# Patient Record
Sex: Male | Born: 1970 | Race: Black or African American | Hispanic: No | Marital: Married | Smoking: Former smoker
Health system: Southern US, Community
[De-identification: ages and names within clinical notes are randomized; demographics above are authoritative.]

## PROBLEM LIST (undated history)

## (undated) DIAGNOSIS — G4730 Sleep apnea, unspecified: Secondary | ICD-10-CM

## (undated) DIAGNOSIS — M1990 Unspecified osteoarthritis, unspecified site: Secondary | ICD-10-CM

## (undated) DIAGNOSIS — F419 Anxiety disorder, unspecified: Secondary | ICD-10-CM

## (undated) DIAGNOSIS — F32A Depression, unspecified: Secondary | ICD-10-CM

## (undated) DIAGNOSIS — I10 Essential (primary) hypertension: Secondary | ICD-10-CM

## (undated) DIAGNOSIS — T8859XA Other complications of anesthesia, initial encounter: Secondary | ICD-10-CM

## (undated) DIAGNOSIS — T4145XA Adverse effect of unspecified anesthetic, initial encounter: Secondary | ICD-10-CM

## (undated) HISTORY — DX: Anxiety disorder, unspecified: F41.9

## (undated) HISTORY — PX: OTHER SURGICAL HISTORY: SHX169

## (undated) HISTORY — DX: Unspecified osteoarthritis, unspecified site: M19.90

## (undated) HISTORY — DX: Depression, unspecified: F32.A

## (undated) HISTORY — DX: Essential (primary) hypertension: I10

## (undated) HISTORY — PX: PATELLAR TENDON REPAIR: SHX737

## (undated) HISTORY — DX: Sleep apnea, unspecified: G47.30

---

## 2004-04-12 ENCOUNTER — Ambulatory Visit (HOSPITAL_COMMUNITY): Admission: RE | Admit: 2004-04-12 | Discharge: 2004-04-12 | Payer: Self-pay | Admitting: Orthopaedic Surgery

## 2006-02-26 ENCOUNTER — Ambulatory Visit (HOSPITAL_COMMUNITY): Admission: RE | Admit: 2006-02-26 | Discharge: 2006-02-26 | Payer: Self-pay | Admitting: Interventional Cardiology

## 2006-04-21 HISTORY — PX: ACHILLES TENDON REPAIR: SUR1153

## 2007-07-18 ENCOUNTER — Emergency Department (HOSPITAL_COMMUNITY): Admission: EM | Admit: 2007-07-18 | Discharge: 2007-07-18 | Payer: Self-pay | Admitting: Emergency Medicine

## 2007-10-08 ENCOUNTER — Emergency Department (HOSPITAL_COMMUNITY): Admission: EM | Admit: 2007-10-08 | Discharge: 2007-10-08 | Payer: Self-pay | Admitting: Emergency Medicine

## 2007-11-03 ENCOUNTER — Emergency Department (HOSPITAL_COMMUNITY): Admission: EM | Admit: 2007-11-03 | Discharge: 2007-11-03 | Payer: Self-pay | Admitting: Emergency Medicine

## 2010-09-06 NOTE — Op Note (Signed)
NAMEAMOUS, CREWE               ACCOUNT NO.:  [id number]   MEDICAL RECORD NO.:  [id number]          PATIENT TYPE:  OIB   LOCATION:  NA                           FACILITY:  [facility]   PHYSICIAN:  Vanita Panda. Magnus Ivan, M.D.DATE OF BIRTH:  [birth year]   DATE OF PROCEDURE:  04/12/2004  DATE OF DISCHARGE:                                 OPERATIVE REPORT   PREOPERATIVE DIAGNOSIS:  Left Achilles' tendon acute rupture.   POSTOPERATIVE DIAGNOSIS:  Left Achilles' tendon acute rupture.   PROCEDURE PERFORMED:  Left Achilles' tendon open repair.   SURGEON:  Vanita Panda. Magnus Ivan, M.D.   ANESTHESIA:  Regional block with general anesthesia.   TOURNIQUET TIME:  59 minutes.   COMPLICATIONS:  None.   ESTIMATED BLOOD LOSS:  Minimal.   INDICATIONS FOR PROCEDURE:  Mr. Dangerfield is a 40 year old gentleman who was  playing basketball three nights ago when he felt a pop in his left ankle.  He had weak plantar flexion after this as well as pain in the ankle.  He was  seen by me in the clinic and upon examination I could feel a palpable  deficit in the area of the mid substance of the Achilles' tendon.  He also  had no plantar flexion with  definitive testing using a Thompson test.  It  was recommended, given his acute rupture as well as his age and level of  activity that he should undergo open repair of this acute rupture.  The  risks and benefits of this were explained to him including the risk of re-  rupture without surgery and we surgery.  He agreed to proceed to the  operating room.   PROCEDURE DESCRIPTION:  After informed consent was obtained regional  anesthesia was obtained and he was brought to the operating room.  General  anesthesia was then obtained with him in the supine position.  He was rolled  onto the operating table in the prone position.  He had chest rolls and  padding under crucial areas including his elbow.  His tourniquet was placed  around the thigh and then his left ankle  and leg were prepped and draped  with DuraPrep and sterile drapes  An Esmarch was used to wrap about the  ankle and tourniquet was inflated to 300 mmHg.   An incision was made on the posterior aspect of the leg just medial to the  Achilles' tendon.  This was carried down through the superficial tissue and  almost immediately a hematoma was encountered.  There was noted a large  rupture of the Achilles' tendon approximately 4 cm from the insertion on the  calcaneus.  There was abundant amount of frayed tissue and again hematoma.  The hematoma was cleaned.  The peri-tenon was disrupted as well.  After  cleaning the two ends a #2 fiber wire was used in a Krackow whip stitch  fashion to secure both the proximal and distal pieces of the rupture tear.  Once the whip stitches were applied the ruptured ends were brought together  and secured.  This was then oversewn with interrupted 0 Vicryl  sutures.  The  wound was then irrigated again and the soft  tissue was closed with interrupted 2-0 Vicryl followed by interrupted 2-0  nylon on the skin.  A well padded splint with his foot in a plantar flexed  position was then applied.  The patient was awakened, extubated and taken to  the recovery room in stable condition.  There were no complications       CYB/MEDQ  D:  04/12/2004  T:  04/13/2004  Job:  [id number]

## 2011-01-13 LAB — DIFFERENTIAL
Basophils Absolute: 0
Basophils Relative: 0
Eosinophils Absolute: 0.1
Eosinophils Relative: 1
Lymphocytes Relative: 34
Lymphs Abs: 2.7
Monocytes Absolute: 0.7
Monocytes Relative: 9
Neutro Abs: 4.6
Neutrophils Relative %: 57

## 2011-01-13 LAB — POCT I-STAT, CHEM 8
BUN: 15
Calcium, Ion: 1.21
Chloride: 102
Creatinine, Ser: 1.4
Glucose, Bld: 92
HCT: 44
Hemoglobin: 15
Potassium: 4
Sodium: 137
TCO2: 28

## 2011-01-13 LAB — CBC
HCT: 41.3
Hemoglobin: 14.4
MCHC: 34.9
MCV: 86.1
Platelets: 200
RBC: 4.8
RDW: 13.7
WBC: 8.1

## 2011-12-19 ENCOUNTER — Ambulatory Visit (INDEPENDENT_AMBULATORY_CARE_PROVIDER_SITE_OTHER): Payer: 59 | Admitting: Surgery

## 2012-05-31 ENCOUNTER — Encounter (HOSPITAL_COMMUNITY): Payer: Self-pay

## 2012-05-31 ENCOUNTER — Emergency Department (HOSPITAL_COMMUNITY): Payer: 59

## 2012-05-31 ENCOUNTER — Emergency Department (HOSPITAL_COMMUNITY)
Admission: EM | Admit: 2012-05-31 | Discharge: 2012-05-31 | Disposition: A | Discharge status: Home / Self Care | Payer: 59 | Attending: Emergency Medicine | Admitting: Emergency Medicine

## 2012-05-31 DIAGNOSIS — R079 Chest pain, unspecified: Secondary | ICD-10-CM

## 2012-05-31 DIAGNOSIS — R0989 Other specified symptoms and signs involving the circulatory and respiratory systems: Secondary | ICD-10-CM | POA: Insufficient documentation

## 2012-05-31 DIAGNOSIS — R0789 Other chest pain: Secondary | ICD-10-CM | POA: Insufficient documentation

## 2012-05-31 DIAGNOSIS — R0609 Other forms of dyspnea: Secondary | ICD-10-CM | POA: Insufficient documentation

## 2012-05-31 LAB — CBC WITH DIFFERENTIAL/PLATELET
Basophils Absolute: 0 10*3/uL (ref 0.0–0.1)
Basophils Relative: 0 % (ref 0–1)
Eosinophils Absolute: 0.1 10*3/uL (ref 0.0–0.7)
Eosinophils Relative: 2 % (ref 0–5)
HCT: 40.1 % (ref 39.0–52.0)
Lymphocytes Relative: 38 % (ref 12–46)
Lymphs Abs: 2.3 10*3/uL (ref 0.7–4.0)
MCH: 29.2 pg (ref 26.0–34.0)
MCHC: 34.7 g/dL (ref 30.0–36.0)
Monocytes Absolute: 0.3 10*3/uL (ref 0.1–1.0)
Monocytes Relative: 6 % (ref 3–12)
Neutro Abs: 3.2 10*3/uL (ref 1.7–7.7)
Neutrophils Relative %: 54 % (ref 43–77)
Platelets: 205 10*3/uL (ref 150–400)
RDW: 13.1 % (ref 11.5–15.5)
WBC: 6 10*3/uL (ref 4.0–10.5)

## 2012-05-31 LAB — BASIC METABOLIC PANEL
BUN: 13 mg/dL (ref 6–23)
CO2: 25 mEq/L (ref 19–32)
Calcium: 8.4 mg/dL (ref 8.4–10.5)
Chloride: 97 mEq/L (ref 96–112)
Creatinine, Ser: 1.09 mg/dL (ref 0.50–1.35)
GFR calc Af Amer: >90 mL/min (ref >90)
GFR calc non Af Amer: 82 mL/min — ABNORMAL LOW (ref >90)
Glucose, Bld: 142 mg/dL — ABNORMAL HIGH (ref 70–99)
Potassium: 3.7 mEq/L (ref 3.5–5.1)
Sodium: 131 mEq/L — ABNORMAL LOW (ref 135–145)

## 2012-05-31 LAB — POCT I-STAT TROPONIN I: Troponin i, poc: 0 ng/mL (ref 0.00–0.08)

## 2012-05-31 NOTE — ED Provider Notes (Signed)
History     CSN: [id number]  Arrival date & time 05/31/12  0257   First MD Initiated Contact with Patient 05/31/12 0304      Chief Complaint  Patient presents with  . Chest Pain    (Consider location/radiation/quality/duration/timing/severity/associated sxs/prior treatment) HPI History provided by pt.   Pt has intermittent, squeezing pain in the center of his chest, with an associated sharp pain down his LUE and dyspnea x 2 weeks.  Episodes occur randomly and lasts for 1 second at at time.  Episode that woke him from sleep this mornign was the most severe he has had, prompting him to come to ED.  Has not had fever, cough, abd pain, LE edema/ttp.  Has never had these sx in the past.  No RF for ACS or PE.  No recent trauma or heavy lifting.   History reviewed. No pertinent past medical history.  History reviewed. No pertinent past surgical history.  History reviewed. No pertinent family history.  History  Substance Use Topics  . Smoking status: Not on file  . Smokeless tobacco: Not on file  . Alcohol Use: No      Review of Systems  All other systems reviewed and are negative.    Allergies  Review of patient's allergies indicates no known allergies.  Home Medications  No current outpatient prescriptions on file.  BP 160/89  Pulse 85  Temp(Src) 98.4 F (36.9 C) (Oral)  Resp 16  SpO2 97%  Physical Exam  Nursing note and vitals reviewed. Constitutional: He is oriented to person, place, and time. He appears well-developed and well-nourished. No distress.  HENT:  Head: Normocephalic and atraumatic.  Eyes:  Normal appearance  Neck: Normal range of motion.  Cardiovascular: Normal rate, regular rhythm and intact distal pulses.   Pulmonary/Chest: Effort normal and breath sounds normal. No respiratory distress. He exhibits no tenderness.  No pleuritic pain reported  Abdominal: Soft. Bowel sounds are normal. He exhibits no distension. There is no tenderness. There is no  guarding.  Musculoskeletal: Normal range of motion.  No peripheral edema or calf tenderness  Neurological: He is alert and oriented to person, place, and time.  Skin: Skin is warm and dry. No rash noted.  Psychiatric: He has a normal mood and affect. His behavior is normal.    ED Course  Procedures (including critical care time)   Date: 05/31/2012  Rate: 87  Rhythm: normal sinus rhythm  QRS Axis: normal  Intervals: normal  ST/T Wave abnormalities: normal  Conduction Disutrbances:none  Narrative Interpretation:   Old EKG Reviewed: unchanged   Labs Reviewed  CBC WITH DIFFERENTIAL  BASIC METABOLIC PANEL   Dg Chest 2 View  05/31/2012  *RADIOLOGY REPORT*  Clinical Data: Upper chest pain for 1 week.  CHEST - 2 VIEW  Comparison: Chest radiograph performed 11/03/2007  Findings: The lungs remain hypoexpanded.  Mild bibasilar airspace opacities likely reflect atelectasis, though pneumonia could conceivably have a similar appearance.  Mild vascular crowding is noted.  No pleural effusion or pneumothorax is seen.  The heart is normal in size; the mediastinal contour is within normal limits.  No acute osseous abnormalities are seen.  IMPRESSION: Lungs hypoexpanded; mild basilar airspace opacities likely reflect atelectasis, though mild pneumonia could conceivably have a similar appearance.   Original Report Authenticated By: Tonia Ghent, M.D.      1. Chest pain       MDM  42yo healthy M presents w/ CP.  Doubt ACS; pain is very atypical, EKG  non-ischemic, troponin neg.  No RF for or exam findings concerning for PE.  CXR shows atelectasis vs "mild pneumonia" but low suspicion for pneumonia based on lack of fever and cough.  Pt has been pain free in ED. He is comfortable with outpatient cardiology follow up.  Dr. Juleen China in agreement w/ A&P.  Return precautions discussed.         Otilio Miu, PA-C 05/31/12 2006

## 2012-05-31 NOTE — ED Notes (Signed)
Pt complains of a squeezing chest pain that started earlier tonight and took his breath away. No other symptoms

## 2012-06-03 NOTE — ED Provider Notes (Signed)
Medical screening examination/treatment/procedure(s) were performed by non-physician practitioner and as supervising physician I was immediately available for consultation/collaboration.  Howell Groesbeck, MD 06/03/12 0748 

## 2013-01-10 ENCOUNTER — Ambulatory Visit (INDEPENDENT_AMBULATORY_CARE_PROVIDER_SITE_OTHER): Payer: 59 | Admitting: Neurology

## 2013-01-10 ENCOUNTER — Other Ambulatory Visit: Payer: Self-pay | Admitting: Radiology

## 2013-01-10 ENCOUNTER — Encounter (INDEPENDENT_AMBULATORY_CARE_PROVIDER_SITE_OTHER): Payer: Self-pay | Admitting: Radiology

## 2013-01-10 ENCOUNTER — Other Ambulatory Visit: Payer: Self-pay | Admitting: Neurology

## 2013-01-10 DIAGNOSIS — G5600 Carpal tunnel syndrome, unspecified upper limb: Secondary | ICD-10-CM

## 2013-01-10 DIAGNOSIS — Z0289 Encounter for other administrative examinations: Secondary | ICD-10-CM

## 2013-01-10 NOTE — Procedures (Signed)
    [facility]  NCS (NERVE CONDUCTION STUDY) WITH EMG (ELECTROMYOGRAPHY) REPORT   STUDY DATE:  01/10/2013 PATIENT NAME: Javier Joyce DOB: Dec 11, 1970 MRN: [id number]    TECHNOLOGIST: Kaylyn Lim ELECTROMYOGRAPHER: Levert Feinstein M.D.  CLINICAL INFORMATION:  42 years old right-handed [place] American male, presenting with 3 months history of left hand paresthesia involving first 3 fingers, especially when he ride motor cycle.  FINDINGS: NERVE CONDUCTION STUDY: Bilateral ulnar sensory and motor responses were normal. Left median sensory response was absent. Left median mixed response showed moderately prolonged peak latency, with mildly decreased snap amplitude.  Left median motor response showed multiple moderately prolonged distal latency, with normal C. map amplitude, conduction velocity.  Right median sensory response showed mild to moderately prolonged peak latency.  Right median mixed response showed moderately prolonged peak latency, with normal snap amplitude.  Right median motor response showed moderately prolonged distal latency, with normal C. map amplitude, conduction velocity.    NEEDLE ELECTROMYOGRAPHY: Selected needle examination was performed at left upper extremity muscles, left cervical paraspinal muscles.  Left abductor pollicis brevis,  Normal insertion activity, no spontaneous activity, normal morphology motor unit potential,with decreased recruitment patterns. Needle examination of left pronator teres, extensor digitorum communis, biceps, triceps, deltoid was normal.  There was no spontaneous activity at left cervical paraspinal muscles, at left C5, 6 7        IMPRESSION:   This is an abnormal study. There is electrodiagnostic evidence of bilateral median neuropathy across the wrist, consistent with moderate bilateral carpal tunnel syndromes, left worse than right. There is no evidence of left cervical radiculopathy.   INTERPRETING PHYSICIAN:    Levert Feinstein M.D. Ph.D. [facility] [address], [address] [place], [place] [address] [phone]

## 2016-03-07 ENCOUNTER — Encounter (INDEPENDENT_AMBULATORY_CARE_PROVIDER_SITE_OTHER): Payer: Self-pay | Admitting: Neurology

## 2016-03-07 ENCOUNTER — Ambulatory Visit (INDEPENDENT_AMBULATORY_CARE_PROVIDER_SITE_OTHER): Payer: 59 | Admitting: Neurology

## 2016-03-07 DIAGNOSIS — Z0289 Encounter for other administrative examinations: Secondary | ICD-10-CM

## 2016-03-07 DIAGNOSIS — G5603 Carpal tunnel syndrome, bilateral upper limbs: Secondary | ICD-10-CM

## 2016-03-07 DIAGNOSIS — G5600 Carpal tunnel syndrome, unspecified upper limb: Secondary | ICD-10-CM | POA: Diagnosis not present

## 2016-03-07 NOTE — Procedures (Signed)
Full Name: Javier Joyce Gender: Male MRN #: [id number] Date of Birth: [birth year]    Visit Date: 03/07/2016 11:45 Age: 45 Years 61 Months Old Examining Physician: Marcial Pacas, MD  History: 45 year old male, with history of bilateral carpal tunnel syndrome, presented with worsening intermittent bilateral hands paresthesia especially first 3 fingers, also complains of diffuse muscle achy pain, reported elevated CPK to 900, symptoms has improved with vitamin D supplement.  Summary: Nerve conduction study: Left median sensory response was absent. Left median motor response showed moderately prolonged distal latency, with preserved Cmap amplitude.  Right median sensory response showed moderately prolonged peak latency, with decreased snap amplitude. Right median motor responses showed mild to moderately prolonged distal latency, with preserved Cmap amplitude.  Bilateral ulnar sensory and motor responses were normal  Left superficial peroneal, sural sensory responses were normal. Left tibial, superficial peroneal to EDB motor responses were normal.  Electromyography:  Selected needle examination of left lower extremity, upper extremity, left cervical paraspinal muscles showed no evidence of inflammatory myopathic changes.  Bilateral abductor pollicis brevis showed evidence of decreased recruitment, but there was no evidence of active denervation.   Conclusion: This is an abnormal study, there is electrodiagnostic evidence of bilateral median neuropathy across the wrist, consistent with moderate lateral couple tunnel syndromes, demyelinating in nature, there was no evidence of axonal loss. There was no evidence of inflammatory mild pathic changes.    ------------------------------- Marcial Pacas, M.D.  [facility] [place], [place] [address] Tel: [phone] Fax: [phone]        [facility]    Nerve / Sites Rec. Site Peak Lat Ref. Amp.1-2 Ref. Distance     ms ms V V cm  L Sural - Ankle (Calf)     Calf Ankle 4.38 ?4.40 9.8 ?6.0 14  L Superficial peroneal - Ankle     Lat leg Ankle 3.96 ?4.40 6.8 ?6.0 14  R Median, Ulnar - Transcarpal comparison     Median Palm Wrist 3.75 ?2.20 10.3 ?40.0 8     Ulnar Palm Wrist 2.14 ?2.20 10.9 ?12.0 8          L Median, Ulnar - Transcarpal comparison     Median Palm Wrist NR ?2.20 NR ?40.0 8     Ulnar Palm Wrist 1.93 ?2.20 8.3 ?12.0 8          R Median - Orthodromic (Dig II, Mid palm)     Dig II Wrist 4.79 ?3.40 5.7 ?10.0 13  L Median - Orthodromic (Dig II, Mid palm)     Dig II Wrist NR ?3.40 NR ?10.0 13  R Ulnar - Orthodromic, (Dig V, Mid palm)     Dig V Wrist 2.71 ?3.10 10.0 ?5.0 11  L Ulnar - Orthodromic, (Dig V, Mid palm)     Dig V Wrist 2.55 ?3.10 4.9 ?5.0 11     [facility]    Nerve / Sites Muscle Latency Ref. Amplitude Ref. Rel Amp Segments Distance Lat Diff Velocity Ref. Area    ms ms mV mV %  cm ms m/s m/s mVms  R Median - APB     Wrist APB 5.5 ?4.4 10.2 ?4.0 100 Wrist - APB 7    32.8     Upper arm APB 9.7  6.8  67.1 Upper arm - Wrist 24 4.2 57  24.4  L Median - APB     Wrist APB 7.9 ?4.4 5.0 ?4.0 100 Wrist - APB 7  23.1     Upper arm APB 12.1  5.7  113 Upper arm - Wrist 23 4.2 55    R Ulnar - ADM     Wrist ADM 2.6 ?3.3 11.4 ?6.0 100 Wrist - ADM 7    35.4     B.Elbow ADM 6.5  8.8  77.8 B.Elbow - Wrist 23 3.9 59 ?49 26.2     A.Elbow ADM 8.4  11.7  133 A.Elbow - B.Elbow 10 1.9 53 ?49 31.9         A.Elbow - Wrist  5.8     L Ulnar - ADM     Wrist ADM 2.7 ?3.3 12.8 ?6.0 100 Wrist - ADM 7    39.5     B.Elbow ADM 6.8  11.7  92 B.Elbow - Wrist 23 4.1 56 ?49 35.3     A.Elbow ADM 9.1  12.1  103 A.Elbow - B.Elbow 12 2.3 52 ?49 35.8         A.Elbow - Wrist  6.4     L Peroneal - EDB     Ankle EDB 5.9 ?6.5 4.3 ?2.0 100 Ankle - EDB 9    12.8     Fib head EDB 14.4  4.8  111 Fib head - Ankle 38 8.4 45 ?44      Pop fossa EDB 16.0  4.8  101 Pop fossa - Fib head 8 1.6 50 ?44 14.7         Pop fossa -  Ankle  10.1     L Tibial - AH     Ankle AH 4.8 ?5.8 12.3 ?4.0 100 Ankle - AH 9    29.5     Pop fossa AH 15.9  8.4  68.4 Pop fossa - Ankle 46 11.1 41 ?41 24.8     F  Wave    Nerve F Lat Ref.   ms ms  R Median - APB 32.8 ?31.0  L Ulnar - ADM 31.9 ?32.0     EMG       EMG Summary Table    Spontaneous MUAP Recruitment  Muscle IA Fib PSW Fasc Other Amp Dur. Poly Pattern  L. Tibialis anterior Normal None None None _______ Normal Normal Normal Normal  L. Vastus lateralis Normal None None None _______ Normal Normal Normal Normal  L. Biceps brachii Normal None None None _______ Normal Normal Normal Normal  L. Triceps brachii Normal None None None _______ Normal Normal Normal Normal  L. Deltoid Normal None None None _______ Normal Normal Normal Normal  L. Pronator teres Normal None None None _______ Normal Normal Normal Normal  L. Extensor digitorum communis Normal None None None _______ Normal Normal Normal Normal  L. Cervical paraspinals Normal None None None _______ Normal Normal Normal Normal  L. Abductor pollicis brevis Normal None None None _______ Normal Normal Normal Reduced  R. Abductor pollicis brevis Normal None None None _______ Normal Normal Normal Reduced

## 2016-09-22 DIAGNOSIS — I10 Essential (primary) hypertension: Secondary | ICD-10-CM | POA: Diagnosis not present

## 2016-09-22 DIAGNOSIS — H1045 Other chronic allergic conjunctivitis: Secondary | ICD-10-CM | POA: Diagnosis not present

## 2016-09-22 LAB — LIPID PANEL
Cholesterol: 186 (ref 0–200)
LDL CALC: 104
Triglycerides: 177 — AB (ref 40–160)

## 2016-09-22 LAB — BASIC METABOLIC PANEL
BUN: 14 (ref 4–21)
Creatinine: 1.3 (ref <=1.3)
Glucose: 153
Potassium: 3.9 (ref 3.4–5.3)
SODIUM: 138 (ref 137–147)

## 2016-09-22 LAB — CBC AND DIFFERENTIAL
HCT: 43 (ref 41–53)
Hemoglobin: 15.3 (ref 13.5–17.5)
Platelets: 217 (ref 150–399)
WBC: 8

## 2016-09-22 LAB — VITAMIN B12: VITAMIN B 12: 488

## 2016-09-22 LAB — HEPATIC FUNCTION PANEL
ALK PHOS: 118 (ref 25–125)
ALT: 78 — AB (ref 10–40)
AST: 52 — AB (ref 14–40)
BILIRUBIN, TOTAL: 0.8

## 2016-09-22 LAB — TSH: TSH: 1.8 (ref <=5.90)

## 2016-10-02 DIAGNOSIS — G4733 Obstructive sleep apnea (adult) (pediatric): Secondary | ICD-10-CM | POA: Diagnosis not present

## 2016-10-31 DIAGNOSIS — I10 Essential (primary) hypertension: Secondary | ICD-10-CM | POA: Diagnosis not present

## 2016-12-05 DIAGNOSIS — G4733 Obstructive sleep apnea (adult) (pediatric): Secondary | ICD-10-CM | POA: Diagnosis not present

## 2016-12-18 DIAGNOSIS — K8010 Calculus of gallbladder with chronic cholecystitis without obstruction: Secondary | ICD-10-CM | POA: Diagnosis not present

## 2017-01-07 DIAGNOSIS — G4733 Obstructive sleep apnea (adult) (pediatric): Secondary | ICD-10-CM | POA: Diagnosis not present

## 2017-01-12 ENCOUNTER — Other Ambulatory Visit (HOSPITAL_COMMUNITY): Payer: Self-pay | Admitting: Surgery

## 2017-01-23 ENCOUNTER — Ambulatory Visit (HOSPITAL_COMMUNITY)
Admission: RE | Admit: 2017-01-23 | Discharge: 2017-01-23 | Disposition: A | Discharge status: Home / Self Care | Payer: 59 | Source: Ambulatory Visit | Attending: Surgery | Admitting: Surgery

## 2017-01-28 ENCOUNTER — Encounter: Payer: Self-pay | Admitting: Skilled Nursing Facility1

## 2017-01-28 ENCOUNTER — Encounter: Payer: 59 | Attending: Surgery | Admitting: Skilled Nursing Facility1

## 2017-01-28 DIAGNOSIS — Z713 Dietary counseling and surveillance: Secondary | ICD-10-CM | POA: Insufficient documentation

## 2017-01-28 DIAGNOSIS — E669 Obesity, unspecified: Secondary | ICD-10-CM

## 2017-01-28 NOTE — Progress Notes (Signed)
Pre-Op Assessment Visit:  Pre-Operative Sleeve Gastrectomy Surgery  Medical Nutrition Therapy:  Appt start time: 4:05  End time:  5:05  Patient was seen on 01/28/2017 for Pre-Operative Nutrition Assessment. Assessment and letter of approval faxed to [facility] Surgery Bariatric Surgery Program coordinator on 01/28/2017.   Will work on meal ideas and eating more foods from home.   Pt states he has an active job requiring heavy lifting and walking.   Pt expectation of surgery: fro everything to be easier from choosing clothes to moving  Pt expectation of Dietitian:   Start weight at [facility]: 324.8 BMI: 44.67   24 hr Dietary Recall: 90% meals from outside the home First Meal: skipped Snack:  Second Meal 11:30-2:30: fats food Snack:  Third Meal (6:30-7pm): olive garden Snack:  Beverages: diet soda, flavored carbonated water  Encouraged to engage in 150 minutes of moderate physical activity including cardiovascular and weight baring weekly  Handouts given during visit include:  . Pre-Op Goals . Bariatric Surgery Protein Shakes During the appointment today the following Pre-Op Goals were reviewed with the patient: . Maintain or lose weight as instructed by your surgeon . Make healthy food choices . Begin to limit portion sizes . Limited concentrated sugars and fried foods . Keep fat/sugar in the single digits per serving on             food labels . Practice CHEWING your food  (aim for 30 chews per bite or until applesauce consistency) . Practice not drinking 15 minutes before, during, and 30 minutes after each meal/snack . Avoid all carbonated beverages  . Avoid/limit caffeinated beverages  . Avoid all sugar-sweetened beverages . Consume 3 meals per day; eat every 3-5 hours . Make a list of non-food related activities . Aim for 64-100 ounces of FLUID daily  . Aim for at least 60-80 grams of PROTEIN daily . Look for a liquid protein source that contain ?15 g protein and  ?5 g carbohydrate  (ex: shakes, drinks, shots)  -Follow diet recommendations listed below   Energy and Macronutrient Recomendations: Calories: 2000 Carbohydrate:225 Protein: 150 Fat: 56  Demonstrated degree of understanding via:  Teach Back  Teaching Method Utilized:  Visual Auditory Hands on  Barriers to learning/adherence to lifestyle change: none identified   Patient to call the Nutrition and Diabetes Education Services to enroll in Pre-Op and Post-Op Nutrition Education when surgery date is scheduled.

## 2017-02-03 ENCOUNTER — Other Ambulatory Visit: Payer: Self-pay | Admitting: Surgery

## 2017-02-03 DIAGNOSIS — Z9884 Bariatric surgery status: Secondary | ICD-10-CM

## 2017-02-23 ENCOUNTER — Encounter: Payer: 59 | Attending: Surgery | Admitting: Skilled Nursing Facility1

## 2017-02-23 DIAGNOSIS — E669 Obesity, unspecified: Secondary | ICD-10-CM

## 2017-02-23 DIAGNOSIS — Z713 Dietary counseling and surveillance: Secondary | ICD-10-CM | POA: Diagnosis not present

## 2017-02-23 NOTE — Progress Notes (Signed)
  Assessment:   1st SWL Appointment.   Pt arrives having going about 5 pounds. Only one burger verses 2. Pt states his wife has had the lapband and since then they have had troubles with their relationship and his wife had kidney stones and an enlarged esophagus. Pt states this is hard, I did not even remember I was supposed to be watching what I was eating throughout the month. Dietitian educated the pt on the consequences of not following the recombinations after surgery. Pt states he will work on eating every 5 hours.   Pt needs 6 months of SWL.   Surgery: Sleeve Gastrectomy Start weight at [facility]: 324.8 Wt: 329  MEDICATIONS: See List   DIETARY INTAKE:  24-hr recall:  B ( AM): skipped Snk ( AM): grapes L ( PM): fast food Snk ( PM): grapes D ( PM): skipped Snk ( PM):  Beverages: water  Usual physical activity: ADL's  Diet to Follow: 1800 calories 200 g carbohydrates 135 g protein 50 g fat   Nutritional Diagnosis:  [place]-3.3 Overweight/obesity related to past poor dietary habits and physical inactivity as evidenced by patient w/ planned sleeve surgery following dietary guidelines for continued weight loss.    Intervention:  Nutrition counseling for upcoming Bariatric Surgery. Goals: -Encouraged to engage in 150 minutes of moderate physical activity including cardiovascular and weight baring weekly -Eat every 5 hours  Teaching Method Utilized:  Visual Auditory Hands on  Barriers to learning/adherence to lifestyle change: contemplative stage  Demonstrated degree of understanding via:  Teach Back   Monitoring/Evaluation:  Dietary intake, exercise, and body weight prn.

## 2017-03-03 DIAGNOSIS — I10 Essential (primary) hypertension: Secondary | ICD-10-CM | POA: Diagnosis not present

## 2017-03-06 DIAGNOSIS — G5603 Carpal tunnel syndrome, bilateral upper limbs: Secondary | ICD-10-CM | POA: Diagnosis not present

## 2017-03-25 ENCOUNTER — Ambulatory Visit: Payer: 59 | Admitting: Skilled Nursing Facility1

## 2017-03-31 ENCOUNTER — Encounter: Payer: 59 | Attending: Surgery | Admitting: Skilled Nursing Facility1

## 2017-03-31 ENCOUNTER — Encounter: Payer: Self-pay | Admitting: Skilled Nursing Facility1

## 2017-03-31 DIAGNOSIS — Z713 Dietary counseling and surveillance: Secondary | ICD-10-CM | POA: Insufficient documentation

## 2017-03-31 NOTE — Progress Notes (Signed)
  Assessment:   2nd SWL Appointment.   Pt needs 6 months of SWL.  Pt arrives having gained about 6 pounds. Pt is vexed he needs so many SWL visits. Pt states he is not really eating anything at all. Pt states he cannot bring food into work because he does not trust his coworkers. Pt does not understand why he needs to come to the SWL visits if the surgery is going to do everything for him: Dietitian educated the pt on this subject and pt still repeated, Why do I need to keep coming to the dietitian if the surgery is going to tell me what I can and cannot eat. Pt does not understand the need to fuel/hydrate his body: Dietitian educated the pt on this subject but the pt continued to repeat his previous thoughts.   Surgery: Sleeve Gastrectomy Start weight at [facility]: 324.8 Wt: 335  MEDICATIONS: See List   DIETARY INTAKE:  24-hr recall:  B ( AM): atkins or carnation instant breakfast  Snk ( AM): grapes L ( PM): ritz crackers and cheese Snk ( PM): grapes D ( PM): club sandwich with chips Snk ( PM):  Beverages: crystal light water  Usual physical activity: ADL's  Diet to Follow: 1800 calories 200 g carbohydrates 135 g protein 50 g fat   Nutritional Diagnosis:  [place]-3.3 Overweight/obesity related to past poor dietary habits and physical inactivity as evidenced by patient w/ planned sleeve surgery following dietary guidelines for continued weight loss.    Intervention:  Nutrition counseling for upcoming Bariatric Surgery. Goals: -Encouraged to engage in 150 minutes of moderate physical activity including cardiovascular and weight baring weekly -Eat every 5 hours  Teaching Method Utilized:  Visual Auditory Hands on  Barriers to learning/adherence to lifestyle change: contemplative stage  Demonstrated degree of understanding via:  Teach Back   Monitoring/Evaluation:  Dietary intake, exercise, and body weight prn.

## 2017-04-30 ENCOUNTER — Ambulatory Visit: Payer: 59 | Admitting: Skilled Nursing Facility1

## 2017-05-06 ENCOUNTER — Encounter: Payer: Self-pay | Admitting: Registered"

## 2017-05-06 ENCOUNTER — Encounter: Payer: 59 | Attending: Surgery | Admitting: Registered"

## 2017-05-06 DIAGNOSIS — Z713 Dietary counseling and surveillance: Secondary | ICD-10-CM | POA: Diagnosis present

## 2017-05-06 DIAGNOSIS — E669 Obesity, unspecified: Secondary | ICD-10-CM

## 2017-05-06 NOTE — Patient Instructions (Addendum)
-   Increase vegetables intake with meals and snacks.   - Aim to chew at least 30 times per bite or to applesauce consistency. Aim to listen to your body for when it is satisfied.

## 2017-05-06 NOTE — Progress Notes (Signed)
  Assessment: 3rd SWL Appointment.   Pt needs 6 months of SWL.  Pt arrives having lost about 13.5 pounds from previous visit. Pt states he is drinking between 4-5 16.9 oz bottles a day. Pt states he doesn't have time to eat meals for breakfast and lunch; expects that schedule will slow down soon to enable more well-balanced meals. Pt states his wife had LAGB and he's familiar with some habits. Pt state he eats about 3 1/2 meals a day.   Pt is vexed he needs so many SWL visits. Pt states he is not really eating anything at all. Pt states he cannot bring food into work because he does not trust his coworkers. Pt does not understand why he needs to come to the SWL visits if the surgery is going to do everything for him: Dietitian educated the pt on this subject and pt still repeated, Why do I need to keep coming to the dietitian if the surgery is going to tell me what I can and cannot eat. Pt does not understand the need to fuel/hydrate his body: Dietitian educated the pt on this subject but the pt continued to repeat his previous thoughts.   Surgery: Sleeve Gastrectomy Start weight at [facility]: 324.8 Wt: 321.5 BMI: 44.22  MEDICATIONS: See List   DIETARY INTAKE:  24-hr recall:  B ( AM): Premier protein  Snk ( AM): none L ( PM): Premier protein Snk ( PM): sometimes grapes D ( PM): shrimp, pasta or steak, potatoes Snk ( PM): sometimes grapes or tangerines Beverages: crystal light water, Coke (occasionally)  Usual physical activity: ADL's  Diet to Follow: 1800 calories 200 g carbohydrates 135 g protein 50 g fat   Nutritional Diagnosis:  [place]-3.3 Overweight/obesity related to past poor dietary habits and physical inactivity as evidenced by patient w/ planned sleeve surgery following dietary guidelines for continued weight loss.    Intervention:  Nutrition counseling for upcoming Bariatric Surgery. Goals: -Encouraged to engage in 150 minutes of moderate physical activity including  cardiovascular and weight baring weekly - Increase vegetables intake with meals and snacks.  - Aim to chew at least 30 times per bite or to applesauce consistency. Aim to listen to your body for when it is satisfied.   Teaching Method Utilized:  Visual Auditory Hands on  Barriers to learning/adherence to lifestyle change: contemplative stage   Demonstrated degree of understanding via:  Teach Back   Monitoring/Evaluation:  Dietary intake, exercise, and body weight in 1 month(s).

## 2017-06-02 ENCOUNTER — Encounter: Payer: 59 | Attending: Surgery | Admitting: Registered"

## 2017-06-02 ENCOUNTER — Encounter: Payer: Self-pay | Admitting: Registered"

## 2017-06-02 DIAGNOSIS — Z713 Dietary counseling and surveillance: Secondary | ICD-10-CM | POA: Insufficient documentation

## 2017-06-02 DIAGNOSIS — E669 Obesity, unspecified: Secondary | ICD-10-CM

## 2017-06-02 NOTE — Patient Instructions (Addendum)
-   Aim to not drink 15 minutes before eating, not while eating, and waiting 30 minutes after eating.   - Continue to work on chewing.   - Aim to replace protein shakes during lunch with meals.

## 2017-06-02 NOTE — Progress Notes (Signed)
Surgery: Sleeve Gastrectomy Assessment: 4th SWL Appointment.   Start weight at [facility]: 324.8 Wt: 321.5, 320.0 BMI: 44.01   Pt needs 6 months of SWL.  Pt arrives having lost about 1.5 pounds from previous visit. Pt states he is working on listening to his body for fullness cues. Pt states he did well with chewing at least 30 times per bite and still working on it. Pt states he is ready to have surgery. Pt states he has a friend in [place] who is having post-op complications and he is concerned this will happen to him. Pt was encouraged to create healthy habits now to put himself in a good position for minimal post-op complications. Pt states he has a 97 year old daughter.   Pt states he is drinking between 4-5 16.9 oz bottles a day. Pt states he doesn't have time to eat meals for breakfast and lunch; expects that schedule will slow down soon to enable more well-balanced meals. Pt states his wife had LAGB and he's familiar with some habits. Pt state he eats about 3 1/2 meals a day.   Pt is vexed he needs so many SWL visits. Pt states he is not really eating anything at all. Pt states he cannot bring food into work because he does not trust his coworkers. Pt does not understand why he needs to come to the SWL visits if the surgery is going to do everything for him: Dietitian educated the pt on this subject and pt still repeated, Why do I need to keep coming to the dietitian if the surgery is going to tell me what I can and cannot eat. Pt does not understand the need to fuel/hydrate his body: Dietitian educated the pt on this subject but the pt continued to repeat his previous thoughts.    MEDICATIONS: See List   DIETARY INTAKE:  24-hr recall:  B ( AM): Premier protein  Snk ( AM): none L ( PM): Premier protein or [place] Mikes sub Snk ( PM): sometimes grapes, pineapple, kiwi D ( PM): shrimp, pasta or steak, potatoes Snk ( PM): sometimes grapes or tangerines Beverages: crystal light water, diet Coke  (occasionally), lemonade  Usual physical activity: ADL's  Diet to Follow: 1800 calories 200 g carbohydrates 135 g protein 50 g fat   Nutritional Diagnosis:  -3.3 Overweight/obesity related to past poor dietary habits and physical inactivity as evidenced by patient w/ planned sleeve surgery following dietary guidelines for continued weight loss.    Intervention:  Nutrition counseling for upcoming Bariatric Surgery. Goals: -Encouraged to engage in 150 minutes of moderate physical activity including cardiovascular and weight baring weekly - Aim to not drink 15 minutes before eating, not while eating, and waiting 30 minutes after eating.  - Continue to work on chewing.  - Aim to replace protein shakes during lunch with meals.   Teaching Method Utilized:  Visual Auditory Hands on  Barriers to learning/adherence to lifestyle change: contemplative stage   Demonstrated degree of understanding via:  Teach Back   Monitoring/Evaluation:  Dietary intake, exercise, and body weight in 1 month(s).

## 2017-06-29 ENCOUNTER — Encounter: Payer: 59 | Attending: Surgery | Admitting: Registered"

## 2017-06-29 ENCOUNTER — Encounter: Payer: Self-pay | Admitting: Registered"

## 2017-06-29 DIAGNOSIS — E669 Obesity, unspecified: Secondary | ICD-10-CM

## 2017-06-29 DIAGNOSIS — Z713 Dietary counseling and surveillance: Secondary | ICD-10-CM | POA: Diagnosis not present

## 2017-06-29 NOTE — Patient Instructions (Addendum)
-   Continue to focus on not drinking with meals. Aim to leave drink in fridge until after eating.   - Increase physical activity with 15 min, 3 days/week.

## 2017-06-29 NOTE — Progress Notes (Signed)
Surgery: Sleeve Gastrectomy Assessment: 5th SWL Appointment.   Start weight at [facility]: 324.8 Wt: 326.7 BMI: 44.93   Pt needs 6 months of SWL.  Pt arrives having gained about 6.7 pounds from previous visit. Pt states he is still working on chewing; going well. Pt states not drinking with meals is most challenging especially when eating meats. Pt states he knows it is just a habit of drinking while eating. Pt states he has been eating more meals during lunch time instead of drinking shakes. Pt reports frustration with communicating with [facility] about moving forward with his surgery date.   Pt states he is working on listening to his body for fullness cues. Pt states he is ready to have surgery. Pt states he has a 59 year old daughter.   Pt states he is drinking between 4-5 16.9 oz bottles a day. Pt state he eats about 3 1/2 meals a day.   Pt is vexed he needs so many SWL visits. Pt states he is not really eating anything at all. Pt states he cannot bring food into work because he does not trust his coworkers. Pt does not understand why he needs to come to the SWL visits if the surgery is going to do everything for him: Dietitian educated the pt on this subject and pt still repeated, Why do I need to keep coming to the dietitian if the surgery is going to tell me what I can and cannot eat. Pt does not understand the need to fuel/hydrate his body: Dietitian educated the pt on this subject but the pt continued to repeat his previous thoughts.    MEDICATIONS: See List   DIETARY INTAKE:  24-hr recall:  B ( AM): Premier protein  Snk ( AM): none L ( PM): Premier protein or [place] Mikes sub Snk ( PM): mangoes, plums D ( PM): shrimp, pasta or steak, potatoes or grilled chicken, broccoli, cauliflower mash Snk ( PM): cheese-itz Beverages: crystal light water, diet Coke (occasionally), lemonade  Usual physical activity: ADL's; limited lower body mobility due to knees  Diet to Follow: 1800 calories 200  g carbohydrates 135 g protein 50 g fat   Nutritional Diagnosis:  [place]-3.3 Overweight/obesity related to past poor dietary habits and physical inactivity as evidenced by patient w/ planned sleeve surgery following dietary guidelines for continued weight loss.    Intervention:  Nutrition counseling for upcoming Bariatric Surgery. Goals: -Encouraged to engage in 150 minutes of moderate physical activity including cardiovascular and weight baring weekly - Continue to focus on not drinking with meals. Aim to leave drink in fridge until after eating.  - Increase physical activity with 15 min, 3 days/week.  Teaching Method Utilized:  Visual Auditory Hands on  Barriers to learning/adherence to lifestyle change: contemplative stage   Demonstrated degree of understanding via:  Teach Back   Monitoring/Evaluation:  Dietary intake, exercise, and body weight in 1 month(s).

## 2017-07-21 ENCOUNTER — Encounter: Payer: Self-pay | Admitting: Family Medicine

## 2017-07-21 ENCOUNTER — Ambulatory Visit: Payer: 59 | Admitting: Family Medicine

## 2017-07-21 VITALS — BP 164/90 | HR 80 | Ht 72.0 in | Wt 322.4 lb

## 2017-07-21 DIAGNOSIS — M25562 Pain in left knee: Secondary | ICD-10-CM

## 2017-07-21 DIAGNOSIS — Z114 Encounter for screening for human immunodeficiency virus [HIV]: Secondary | ICD-10-CM | POA: Diagnosis not present

## 2017-07-21 DIAGNOSIS — N529 Male erectile dysfunction, unspecified: Secondary | ICD-10-CM

## 2017-07-21 DIAGNOSIS — G8929 Other chronic pain: Secondary | ICD-10-CM | POA: Diagnosis not present

## 2017-07-21 DIAGNOSIS — M25561 Pain in right knee: Secondary | ICD-10-CM | POA: Diagnosis not present

## 2017-07-21 DIAGNOSIS — I10 Essential (primary) hypertension: Secondary | ICD-10-CM

## 2017-07-21 LAB — BASIC METABOLIC PANEL
BUN: 13 mg/dL (ref 6–23)
CO2: 29 mEq/L (ref 19–32)
Calcium: 9 mg/dL (ref 8.4–10.5)
Chloride: 99 mEq/L (ref 96–112)
Creatinine, Ser: 1.11 mg/dL (ref 0.40–1.50)
GFR: 90.97 mL/min (ref >60.00)
Glucose, Bld: 134 mg/dL — ABNORMAL HIGH (ref 70–99)
Potassium: 3.5 mEq/L (ref 3.5–5.1)
Sodium: 136 mEq/L (ref 135–145)

## 2017-07-21 MED ORDER — AVANAFIL 200 MG PO TABS: 1.0000 | ORAL_TABLET | Freq: Every day | ORAL | 3 refills | Status: DC | PRN

## 2017-07-21 MED ORDER — LISINOPRIL 20 MG PO TABS
20.0000 mg | ORAL_TABLET | Freq: Every day | ORAL | 3 refills | Status: DC
Start: 2017-07-21 — End: 2017-08-18

## 2017-07-21 NOTE — Assessment & Plan Note (Signed)
BP elevated, no longer taking losartan due to concern about recall.  Will change to lisinopril, he will let me know if he has any issues with tolerance of this.  He'll continue to follow with dietician.  Return for BP check in ~3-4 weeks.

## 2017-07-21 NOTE — Assessment & Plan Note (Signed)
In planning stages for bariatric surgery.  Encouraged to continue to follow with dietician and work on weight loss.

## 2017-07-21 NOTE — Progress Notes (Addendum)
Subjective:    Patient ID: Javier Joyce, male    DOB: 09/08/1970, 47 y.o.   MRN: [id number]  Chief Complaint  Patient presents with  . New Patient (Initial Visit)  . Hypertension  . Knee Pain  . Erectile Dysfunction   Javier Joyce is a very pleasant 47 y.o. male familiar to me from the [place] [facility].     Hypertension  This is a chronic problem. The current episode started more than 1 year ago. The problem is uncontrolled. Pertinent negatives include no anxiety, blurred vision, chest pain, headaches, malaise/fatigue, neck pain, orthopnea, palpitations, peripheral edema or shortness of breath. There are no associated agents to hypertension. Risk factors for coronary artery disease include obesity and male gender. Past treatments include angiotensin blockers (Stopped taking due to recall of losartan. ). Compliance problems include diet and exercise (Has been seeing dietician as he is in stages of planning for bariatric surgery.).  There is no history of angina, kidney disease, CAD/MI, CVA or heart failure.  Erectile Dysfunction  This is a chronic problem. The current episode started more than 1 month ago. The problem has been waxing and waning since onset. The nature of his difficulty is achieving erection and maintaining erection. He reports no anxiety, decreased libido or performance anxiety. He reports his erection duration to be 1 to 5 minutes. Obstructive symptoms do not include incomplete emptying, an intermittent stream or straining. Pertinent negatives include no chills, genital pain or inability to urinate. Nothing aggravates the symptoms. Past treatments include sildenafil. The treatment provided moderate relief. He has had no adverse reactions caused by medications. Risk factors include hypertension.  Knee pain History of bilateral repair of patellar tendon 2/2 to injury from [employer].  Had injection in Oct-Nov of last year by me through the [place] system.  Reports that  this worked very well for him at the time. Reports that he is having some increased pain again with feeling of knee "slipping".  Denies any swelling or locking of the knee at this time. Prior xrays completed through the [place] system, will have records sent over.        Review of Systems  Constitutional: Negative for chills and malaise/fatigue.  Eyes: Negative for blurred vision.  Respiratory: Negative for shortness of breath.   Cardiovascular: Negative for chest pain, palpitations and orthopnea.  Genitourinary: Negative for decreased libido and incomplete emptying.  Musculoskeletal: Negative for neck pain.  Neurological: Negative for headaches.  Psychiatric/Behavioral: The patient is not nervous/anxious.   Additional ROS per HPI, otherwise negative  Past Medical History:  Diagnosis Date  . Hypertension   . Sleep apnea    Past Surgical History:  Procedure Laterality Date  . PATELLAR TENDON REPAIR Bilateral   . right and left knee repair          Objective:   Physical Exam  Constitutional: He is oriented to person, place, and time. He appears well-nourished. No distress.  HENT:  Head: Normocephalic and atraumatic.  Mouth/Throat: Oropharynx is clear and moist.  Eyes: No scleral icterus.  Neck: Neck supple. No thyromegaly present.  Cardiovascular: Normal rate, regular rhythm and normal heart sounds.  Pulmonary/Chest: Effort normal and breath sounds normal.  Musculoskeletal:  Bilateral anterior scars along both knees.  No effusion noted.  Mild grinding sensation with flexion/extension.  Increased pain with patellar compression.  No patellar subluxation or significant instability.   Lymphadenopathy:    He has no cervical adenopathy.  Neurological: He is alert and  oriented to person, place, and time.  Psychiatric: He has a normal mood and affect. His behavior is normal.          Assessment & Plan:  Bilateral knee pain Chronic bilateral knee pain, history of prior patellar  tendon repair.  Good response to steroid injection previously and would like to have repeated.  He will return in ~3-4 weeks to have this done.  Discussed importance of weight loss to help with knee pain as well.   Essential hypertension BP elevated, no longer taking losartan due to concern about recall.  Will change to lisinopril, he will let me know if he has any issues with tolerance of this.  He'll continue to follow with dietician.  Return for BP check in ~3-4 weeks.   Morbid obesity ([place]) In planning stages for bariatric surgery.  Encouraged to continue to follow with dietician and work on weight loss.   Erectile dysfunction Has tried sildenafil previously, does not appear to be on current formulary.  It appears that stendra is on current formulary, will rx trial of this.

## 2017-07-21 NOTE — Assessment & Plan Note (Signed)
Has tried sildenafil previously, does not appear to be on current formulary.  It appears that stendra is on current formulary, will rx trial of this.

## 2017-07-21 NOTE — Patient Instructions (Signed)
Welcome to [facility] It was great to see you again  Start lisinopril in place of losartan for blood pressure  I will see you back in about 4 weeks for knee injection and BP recheck.    Hypertension Hypertension is another name for high blood pressure. High blood pressure forces your heart to work harder to pump blood. This can cause problems over time. There are two numbers in a blood pressure reading. There is a top number (systolic) over a bottom number (diastolic). It is best to have a blood pressure below 120/80. Healthy choices can help lower your blood pressure. You may need medicine to help lower your blood pressure if:  Your blood pressure cannot be lowered with healthy choices.  Your blood pressure is higher than 130/80.  Follow these instructions at home: Eating and drinking  If directed, follow the DASH eating plan. This diet includes: ? Filling half of your plate at each meal with fruits and vegetables. ? Filling one quarter of your plate at each meal with whole grains. Whole grains include whole wheat pasta, brown rice, and whole grain bread. ? Eating or drinking low-fat dairy products, such as skim milk or low-fat yogurt. ? Filling one quarter of your plate at each meal with low-fat (lean) proteins. Low-fat proteins include fish, skinless chicken, eggs, beans, and tofu. ? Avoiding fatty meat, cured and processed meat, or chicken with skin. ? Avoiding premade or processed food.  Eat less than 1,500 mg of salt (sodium) a day.  Limit alcohol use to no more than 1 drink a day for nonpregnant women and 2 drinks a day for men. One drink equals 12 oz of beer, 5 oz of wine, or 1 oz of hard liquor. Lifestyle  Work with your doctor to stay at a healthy weight or to lose weight. Ask your doctor what the best weight is for you.  Get at least 30 minutes of exercise that causes your heart to beat faster (aerobic exercise) most days of the week. This  may include walking, swimming, or biking.  Get at least 30 minutes of exercise that strengthens your muscles (resistance exercise) at least 3 days a week. This may include lifting weights or pilates.  Do not use any products that contain nicotine or tobacco. This includes cigarettes and e-cigarettes. If you need help quitting, ask your doctor.  Check your blood pressure at home as told by your doctor.  Keep all follow-up visits as told by your doctor. This is important. Medicines  Take over-the-counter and prescription medicines only as told by your doctor. Follow directions carefully.  Do not skip doses of blood pressure medicine. The medicine does not work as well if you skip doses. Skipping doses also puts you at risk for problems.  Ask your doctor about side effects or reactions to medicines that you should watch for. Contact a doctor if:  You think you are having a reaction to the medicine you are taking.  You have headaches that keep coming back (recurring).  You feel dizzy.  You have swelling in your ankles.  You have trouble with your vision. Get help right away if:  You get a very bad headache.  You start to feel confused.  You feel weak or numb.  You feel faint.  You get very bad pain in your: ? Chest. ? Belly (abdomen).  You throw up (vomit) more than once.  You have trouble breathing. Summary  Hypertension is another name for high  blood pressure.  Making healthy choices can help lower blood pressure. If your blood pressure cannot be controlled with healthy choices, you may need to take medicine. This information is not intended to replace advice given to you by your health care provider. Make sure you discuss any questions you have with your health care provider. Document Released: 09/24/2007 Document Revised: 03/05/2016 Document Reviewed: 03/05/2016 Elsevier Interactive Patient Education  [employer].

## 2017-07-21 NOTE — Assessment & Plan Note (Signed)
Chronic bilateral knee pain, history of prior patellar tendon repair.  Good response to steroid injection previously and would like to have repeated.  He will return in ~3-4 weeks to have this done.  Discussed importance of weight loss to help with knee pain as well.

## 2017-07-22 LAB — HIV ANTIBODY (ROUTINE TESTING W REFLEX): HIV 1&2 Ab, 4th Generation: NONREACTIVE

## 2017-07-24 ENCOUNTER — Telehealth: Payer: Self-pay | Admitting: Emergency Medicine

## 2017-07-24 ENCOUNTER — Encounter: Payer: Self-pay | Admitting: Family Medicine

## 2017-07-24 NOTE — Telephone Encounter (Signed)
Patient notified- he will check on pricing. This is what works best for him.

## 2017-07-24 NOTE — Telephone Encounter (Signed)
Spoke with pharmacy regarding reasoning for patient only receiving 3 tablets instead of the 5 tablets that was sent in by PCP for [facility]. Pharmacy stated that patient insurance will only cover 3 pills every 31 days. If patient would like the other to tablets he would have to pay out of pocket. Attempted to contact patient to explained what happened, no answer, will try another time. CRM created.

## 2017-07-31 ENCOUNTER — Ambulatory Visit: Payer: 59 | Admitting: Registered"

## 2017-08-03 ENCOUNTER — Encounter: Payer: Self-pay | Admitting: Skilled Nursing Facility1

## 2017-08-03 ENCOUNTER — Encounter: Payer: 59 | Attending: Surgery | Admitting: Skilled Nursing Facility1

## 2017-08-03 DIAGNOSIS — Z713 Dietary counseling and surveillance: Secondary | ICD-10-CM | POA: Diagnosis not present

## 2017-08-03 NOTE — Progress Notes (Signed)
Pre-Operative Nutrition Class:  Appt start time: 0379   End time:  1830.  Patient was seen on 08/03/2017 for Pre-Operative Bariatric Surgery Education at the [facility].   Surgery date:  Surgery type: sleeve Start weight at [facility]: 324.8 Weight today: 320.9  Samples given per MNT protocol. Patient educated on appropriate usage: Bariatric Advantage Multivitamin Lot # [id number] Exp: 10/20  Bariatric Fusion Calcium   Unjury Protein  Shake Lot # [id number] Exp: oct-3-19  The following the learning objectives were met by the patient during this course:  Identify Pre-Op Dietary Goals and will begin 2 weeks pre-operatively  Identify appropriate sources of fluids and proteins   State protein recommendations and appropriate sources pre and post-operatively  Identify Post-Operative Dietary Goals and will follow for 2 weeks post-operatively  Identify appropriate multivitamin and calcium sources  Describe the need for physical activity post-operatively and will follow MD recommendations  State when to call healthcare provider regarding medication questions or post-operative complications  Handouts given during class include:  Pre-Op Bariatric Surgery Diet Handout  Protein Shake Handout  Post-Op Bariatric Surgery Nutrition Handout  BELT Program Information Flyer  Support Group Information Flyer  [facility] Bariatric Supplements Price List  Follow[facility] Plan: [facility] diet advancement per MD.

## 2017-08-04 ENCOUNTER — Encounter: Payer: 59 | Admitting: Registered"

## 2017-08-04 ENCOUNTER — Encounter: Payer: Self-pay | Admitting: Registered"

## 2017-08-04 DIAGNOSIS — Z713 Dietary counseling and surveillance: Secondary | ICD-10-CM | POA: Diagnosis not present

## 2017-08-04 DIAGNOSIS — E669 Obesity, unspecified: Secondary | ICD-10-CM

## 2017-08-04 NOTE — Patient Instructions (Addendum)
-   Take multivitamin complete and Vitamin D supplement.   - Find at least 1-2 other flavors of protein shakes that you can tolerate.   - Aim to increase physical activity with walking 4 mi/week.

## 2017-08-04 NOTE — Progress Notes (Signed)
Surgery: Sleeve Gastrectomy Assessment: 6th SWL Appointment.   Start weight at [facility]: 324.8 Wt: 319.7 BMI: 43.97   Pt needs 6 months of SWL.  Pt arrives having lost about 7 pounds from previous visit. Pt states he is working on increasing his tolerance of seafood, prefers shrimp and lobster but not fish. Pt states not drinking with meals is going well. Pt states he is ready to have surgery.   Pt states he is still working on chewing; going well. Pt states he has been eating more meals during lunch time instead of drinking shakes.   Pt states he is working on listening to his body for fullness cues. Pt states he has a 49 year old daughter.   Pt states he is drinking between 4-5 16.9 oz bottles a day. Pt state he eats about 3 1/2 meals a day.   Pt is vexed he needs so many SWL visits. Pt states he is not really eating anything at all. Pt states he cannot bring food into work because he does not trust his coworkers. Pt does not understand why he needs to come to the SWL visits if the surgery is going to do everything for him: Dietitian educated the pt on this subject and pt still repeated, Why do I need to keep coming to the dietitian if the surgery is going to tell me what I can and cannot eat. Pt does not understand the need to fuel/hydrate his body: Dietitian educated the pt on this subject but the pt continued to repeat his previous thoughts.    MEDICATIONS: See List   DIETARY INTAKE:  24-hr recall:  B ( AM): Premier protein  Snk ( AM): none L ( PM): Premier protein or [place] Mikes sub Snk ( PM): mangoes, plums D ( PM): shrimp, pasta or steak, potatoes or grilled chicken, broccoli, cauliflower mash Snk ( PM): cheese-itz Beverages: crystal light water, diet Coke (occasionally), lemonade  Usual physical activity: ADL's; limited lower body mobility due to knees  Diet to Follow: 1800 calories 200 g carbohydrates 135 g protein 50 g fat   Nutritional Diagnosis:  [place]-3.3  Overweight/obesity related to past poor dietary habits and physical inactivity as evidenced by patient w/ planned sleeve surgery following dietary guidelines for continued weight loss.    Intervention:  Nutrition counseling for upcoming Bariatric Surgery. Goals: -Encouraged to engage in 150 minutes of moderate physical activity including cardiovascular and weight baring weekly - Take multivitamin complete and Vitamin D supplement.  - Find at least 1-2 other flavors of protein shakes that you can tolerate.  - Aim to increase physical activity with walking 4 mi/week.   Teaching Method Utilized:  Visual Auditory Hands on  Barriers to learning/adherence to lifestyle change: contemplative stage   Demonstrated degree of understanding via:  Teach Back   Monitoring/Evaluation:  Dietary intake, exercise, and body weight prn.

## 2017-08-18 ENCOUNTER — Encounter: Payer: Self-pay | Admitting: Emergency Medicine

## 2017-08-18 ENCOUNTER — Encounter: Payer: Self-pay | Admitting: Family Medicine

## 2017-08-18 ENCOUNTER — Ambulatory Visit: Payer: 59 | Admitting: Family Medicine

## 2017-08-18 VITALS — BP 122/90 | HR 82 | Wt 322.4 lb

## 2017-08-18 DIAGNOSIS — M25562 Pain in left knee: Secondary | ICD-10-CM

## 2017-08-18 DIAGNOSIS — I10 Essential (primary) hypertension: Secondary | ICD-10-CM | POA: Diagnosis not present

## 2017-08-18 DIAGNOSIS — G8929 Other chronic pain: Secondary | ICD-10-CM

## 2017-08-18 DIAGNOSIS — M25561 Pain in right knee: Secondary | ICD-10-CM

## 2017-08-18 MED ORDER — VALSARTAN 80 MG PO TABS: 80.0000 mg | ORAL_TABLET | Freq: Every day | ORAL | 1 refills | Status: DC

## 2017-08-18 MED ORDER — LOSARTAN POTASSIUM 50 MG PO TABS: 50.0000 mg | ORAL_TABLET | Freq: Every day | ORAL | 1 refills | Status: DC

## 2017-08-18 NOTE — Assessment & Plan Note (Signed)
Bilateral knee injection done today See procedure note for details.

## 2017-08-18 NOTE — Assessment & Plan Note (Signed)
BP is better controlled today, unfortunately has developed cough with lisinopril Change to valsartan Follow low salt diet Work on continued weight loss Monitor BP at home.

## 2017-08-18 NOTE — Patient Instructions (Signed)
Start losartan in place of lisinopril Follow low salt diet I hope that your surgery goes well!  I will see you back in about 6 months.

## 2017-08-18 NOTE — Progress Notes (Signed)
Javier Joyce - 47 y.o. male MRN [id number]  Date of birth: 03-07-1971  Subjective Chief Complaint  Patient presents with  . Hypertension  . Knee Pain    HPI Javier Joyce is a 47 y.o. male here today for follow up of HTN and knee pain.    -HTN:  Current tx with lisinopril.  BP has been doing well however unfortunately he tells me that he has developed an occasional cough since starting lisinopril.  He has been working on reduction of sodium intake and weight is down about 7 lbs.  He continues to work with a dietician in preparation for bariatric surgery.  He is scheduled for surgery on 09/15/2017.  He denies chest pain, shortness of breath, palpitations, headache or vision changes  -Knee pain:  History of bilateral patellar tendon repair as well as tricompartmental changes related to OA on previous xrays.  Reports stiffness and pain with weight bearing and stairs.  He denies swelling.  He has had relief from injection to the knees in the past and would like to have this done again.     ROS:  ROS completed and negative except as noted per HPI  Allergies  Allergen Reactions  . Lisinopril Cough    Past Medical History:  Diagnosis Date  . Hypertension   . Sleep apnea     Past Surgical History:  Procedure Laterality Date  . PATELLAR TENDON REPAIR Bilateral   . right and left knee repair      Social History   Socioeconomic History  . Marital status: Married    Spouse name: Not on file  . Number of children: Not on file  . Years of education: Not on file  . Highest education level: Not on file  Occupational History  . Not on file  Social Needs  . Financial resource strain: Not on file  . Food insecurity:    Worry: Never true    Inability: Never true  . Transportation needs:    Medical: Not on file    Non-medical: Not on file  Tobacco Use  . Smoking status: Current Some Day Smoker  . Smokeless tobacco: Never Used  Substance and Sexual Activity  . Alcohol use: Yes      Comment: occass  . Drug use: Never  . Sexual activity: Yes  Lifestyle  . Physical activity:    Days per week: Not on file    Minutes per session: Not on file  . Stress: Not on file  Relationships  . Social connections:    Talks on phone: Not on file    Gets together: Not on file    Attends religious service: Not on file    Active member of club or organization: Not on file    Attends meetings of clubs or organizations: Not on file    Relationship status: Not on file  Other Topics Concern  . Not on file  Social History Narrative  . Not on file    History reviewed. No pertinent family history.  Health Maintenance  Topic Date Due  . INFLUENZA VACCINE  11/19/2017  . TETANUS/TDAP  04/21/2026  . HIV Screening  Completed    ----------------------------------------------------------------------------------------------------------------------------------------------------------------------------------------------------------------- Physical Exam BP 122/90 (BP Location: Right Arm, Patient Position: Sitting, Cuff Size: Large)   Pulse 82   Wt (!) 322 lb 6.4 oz (146.2 kg)   BMI 44.34 kg/m   Physical Exam  Constitutional: He is oriented to person, place, and time. He appears well-nourished. No distress.  HENT:  Mouth/Throat: Oropharynx is clear and moist.  Eyes: No scleral icterus.  Neck: Neck supple. No thyromegaly present.  Cardiovascular: Normal rate, regular rhythm and normal heart sounds.  Pulmonary/Chest: Effort normal and breath sounds normal.  Musculoskeletal:  Scars along anterior knee bilaterally. TTP along medial joint line bilaterally.  ROM is WNL but with some pain on extension.  Negative meniscal provocation testing.   Lymphadenopathy:    He has no cervical adenopathy.  Neurological: He is alert and oriented to person, place, and time.  Psychiatric: He has a normal mood and affect. His behavior is normal.   Procedure Note:  Procedure explained in detail and  potential complications reviewed.  He was given opportunity for any questions.  Consent obtained.  Time out completed and patient was prepped in typical sterile fashion.  Ethyl chloride was applied to the skin and the left knee was then injected with 52mL  40mg /mL methylprednisolone and 32mL of 1% lidocaine using an anterior approach.  He tolerated this well.  Band-aid was applied.  The Right knee was then prepped in typical sterile fashion.   Ethyl chloride was applied to the skin and the right knee was then injected with 65mL  40mg /mL methylprednisolone and 14mL of 1% lidocaine using an anterior approach.  He tolerated this well.  Band-aid was applied. ------------------------------------------------------------------------------------------------------------------------------------------------------------------------------------------------------------------- Assessment and Plan  Bilateral knee pain Bilateral knee injection done today See procedure note for details.   Essential hypertension BP is better controlled today, unfortunately has developed cough with lisinopril Change to valsartan Follow low salt diet Work on continued weight loss Monitor BP at home.

## 2017-09-04 NOTE — Patient Instructions (Signed)
Javier Joyce  09/04/2017   Your procedure is scheduled on: 09-15-17   Report to [facility] Main  Entrance    Report to admitting at 7:25 AM    Call this number if you have problems the morning of surgery [phone]   Remember: Do not eat food or drink liquids :After Midnight.     Take these medicines the morning of surgery with A SIP OF WATER: None                                You may not have any metal on your body including hair pins and              piercings  Do not wear jewelry, lotions, powders or deodorant             Men may shave face and neck.   Do not bring valuables to the hospital. [name].  Contacts, dentures or bridgework may not be worn into surgery.  Leave suitcase in the car. After surgery it may be brought to your room.      Special Instructions: N/A              Please read over the following fact sheets you were given: _____________________________________________________________________             [facility] - Preparing for Surgery Before surgery, you can play an important role.  Because skin is not sterile, your skin needs to be as free of germs as possible.  You can reduce the number of germs on your skin by washing with CHG (chlorahexidine gluconate) soap before surgery.  CHG is an antiseptic cleaner which kills germs and bonds with the skin to continue killing germs even after washing. Please DO NOT use if you have an allergy to CHG or antibacterial soaps.  If your skin becomes reddened/irritated stop using the CHG and inform your nurse when you arrive at Short Stay. Do not shave (including legs and underarms) for at least 48 hours prior to the first CHG shower.  You may shave your face/neck. Please follow these instructions carefully:  1.  Shower with CHG Soap the night before surgery and the  morning of Surgery.  2.  If you choose to wash your hair, wash your hair  first as usual with your  normal  shampoo.  3.  After you shampoo, rinse your hair and body thoroughly to remove the  shampoo.                           4.  Use CHG as you would any other liquid soap.  You can apply chg directly  to the skin and wash                       Gently with a scrungie or clean washcloth.  5.  Apply the CHG Soap to your body ONLY FROM THE NECK DOWN.   Do not use on face/ open                           Wound or open sores. Avoid contact  with eyes, ears mouth and genitals (private parts).                       Wash face,  Genitals (private parts) with your normal soap.             6.  Wash thoroughly, paying special attention to the area where your surgery  will be performed.  7.  Thoroughly rinse your body with warm water from the neck down.  8.  DO NOT shower/wash with your normal soap after using and rinsing off  the CHG Soap.                9.  Pat yourself dry with a clean towel.            10.  Wear clean pajamas.            11.  Place clean sheets on your bed the night of your first shower and do not  sleep with pets. Day of Surgery : Do not apply any lotions/deodorants the morning of surgery.  Please wear clean clothes to the hospital/surgery center.  FAILURE TO FOLLOW THESE INSTRUCTIONS MAY RESULT IN THE CANCELLATION OF YOUR SURGERY PATIENT SIGNATURE_________________________________  NURSE SIGNATURE__________________________________

## 2017-09-04 NOTE — Progress Notes (Signed)
01-23-17 (Epic) EKG, CXR

## 2017-09-08 ENCOUNTER — Inpatient Hospital Stay (HOSPITAL_COMMUNITY)
Admission: RE | Admit: 2017-09-08 | Discharge: 2017-09-08 | Disposition: A | Discharge status: Home / Self Care | Payer: 59 | Source: Ambulatory Visit

## 2017-09-09 ENCOUNTER — Encounter (HOSPITAL_COMMUNITY)
Admission: RE | Admit: 2017-09-09 | Discharge: 2017-09-09 | Disposition: A | Discharge status: Home / Self Care | Payer: 59 | Source: Ambulatory Visit | Attending: Surgery | Admitting: Surgery

## 2017-09-09 ENCOUNTER — Other Ambulatory Visit: Payer: Self-pay

## 2017-09-09 ENCOUNTER — Encounter (HOSPITAL_COMMUNITY): Payer: Self-pay

## 2017-09-09 DIAGNOSIS — Z01818 Encounter for other preprocedural examination: Secondary | ICD-10-CM | POA: Insufficient documentation

## 2017-09-09 HISTORY — DX: Adverse effect of unspecified anesthetic, initial encounter: T41.45XA

## 2017-09-09 HISTORY — DX: Other complications of anesthesia, initial encounter: T88.59XA

## 2017-09-09 LAB — BASIC METABOLIC PANEL
ANION GAP: 8 (ref 5–15)
BUN: 15 mg/dL (ref 6–20)
CALCIUM: 9.5 mg/dL (ref 8.9–10.3)
CO2: 26 mmol/L (ref 22–32)
CREATININE: 1.28 mg/dL — AB (ref 0.61–1.24)
Chloride: 102 mmol/L (ref 101–111)
GFR calc Af Amer: >60 mL/min (ref >60)
GFR calc non Af Amer: >60 mL/min (ref >60)
GLUCOSE: 129 mg/dL — AB (ref 65–99)
Potassium: 4.5 mmol/L (ref 3.5–5.1)
Sodium: 136 mmol/L (ref 135–145)

## 2017-09-09 LAB — CBC
HCT: 44.4 % (ref 39.0–52.0)
HEMOGLOBIN: 15.2 g/dL (ref 13.0–17.0)
MCH: 30 pg (ref 26.0–34.0)
MCHC: 34.2 g/dL (ref 30.0–36.0)
MCV: 87.6 fL (ref 78.0–100.0)
Platelets: 192 10*3/uL (ref 150–400)
RBC: 5.07 MIL/uL (ref 4.22–5.81)
RDW: 13.5 % (ref 11.5–15.5)
WBC: 6.9 10*3/uL (ref 4.0–10.5)

## 2017-09-09 NOTE — Patient Instructions (Signed)
Javier Joyce  09/09/2017   Your procedure is scheduled on: 09-15-17   Report to [facility] Main  Entrance   Report to admitting at 7:30 AM    Call this number if you have problems the morning of surgery [phone]   Remember: Do not eat food or drink liquids :After Midnight.     Take these medicines the morning of surgery with A SIP OF WATER: None                                You may not have any metal on your body including hair pins and              piercings  Do not wear jewelry, lotions, powders or deodorant             Men may shave face and neck.   Do not bring valuables to the hospital. [place].  Contacts, dentures or bridgework may not be worn into surgery.  Leave suitcase in the car. After surgery it may be brought to your room.     Special Instructions: Please bring your mask and tubing for your CPAP machine             Please read over the following fact sheets you were given: _____________________________________________________________________          [facility] - Preparing for Surgery Before surgery, you can play an important role.  Because skin is not sterile, your skin needs to be as free of germs as possible.  You can reduce the number of germs on your skin by washing with CHG (chlorahexidine gluconate) soap before surgery.  CHG is an antiseptic cleaner which kills germs and bonds with the skin to continue killing germs even after washing. Please DO NOT use if you have an allergy to CHG or antibacterial soaps.  If your skin becomes reddened/irritated stop using the CHG and inform your nurse when you arrive at Short Stay. Do not shave (including legs and underarms) for at least 48 hours prior to the first CHG shower.  You may shave your face/neck. Please follow these instructions carefully:  1.  Shower with CHG Soap the night before surgery and the  morning of Surgery.  2.  If you  choose to wash your hair, wash your hair first as usual with your  normal  shampoo.  3.  After you shampoo, rinse your hair and body thoroughly to remove the  shampoo.                           4.  Use CHG as you would any other liquid soap.  You can apply chg directly  to the skin and wash                       Gently with a scrungie or clean washcloth.  5.  Apply the CHG Soap to your body ONLY FROM THE NECK DOWN.   Do not use on face/ open                           Wound or open  sores. Avoid contact with eyes, ears mouth and genitals (private parts).                       Wash face,  Genitals (private parts) with your normal soap.             6.  Wash thoroughly, paying special attention to the area where your surgery  will be performed.  7.  Thoroughly rinse your body with warm water from the neck down.  8.  DO NOT shower/wash with your normal soap after using and rinsing off  the CHG Soap.                9.  Pat yourself dry with a clean towel.            10.  Wear clean pajamas.            11.  Place clean sheets on your bed the night of your first shower and do not  sleep with pets. Day of Surgery : Do not apply any lotions/deodorants the morning of surgery.  Please wear clean clothes to the hospital/surgery center.  FAILURE TO FOLLOW THESE INSTRUCTIONS MAY RESULT IN THE CANCELLATION OF YOUR SURGERY PATIENT SIGNATURE_________________________________  NURSE SIGNATURE__________________________________  ________________________________________________________________________

## 2017-09-14 NOTE — H&P (Signed)
Chief Complaint:  Morbid obesity  History of Present Illness:  Javier Joyce is an 47 y.o. male whose wife has a lapband for years and who was seen in the office for a sleeve gastrectomy.  He has undergone 6 months of supervised weight loss and is ready for sleeve gastrectomy.  Past Medical History:  Diagnosis Date  . Complication of anesthesia    Pt has woken up during the procedure  . Hypertension   . Sleep apnea     Past Surgical History:  Procedure Laterality Date  . ACHILLES TENDON REPAIR Bilateral 2008  . PATELLAR TENDON REPAIR Bilateral   . right and left knee repair      No current facility-administered medications for this encounter.    Current Outpatient Medications  Medication Sig Dispense Refill  . Avanafil (STENDRA) 200 MG TABS Take 1 tablet by mouth daily as needed (sexual activity). (Patient taking differently: Take 200 tablets by mouth daily as needed (sexual activity). ) 5 tablet 3  . ketotifen (ZADITOR) 0.025 % ophthalmic solution Place 1 drop into both eyes every 4 (four) hours as needed (itchy eyes).    . valsartan (DIOVAN) 80 MG tablet Take 1 tablet (80 mg total) by mouth daily. 90 tablet 1  . tadalafil (CIALIS) 5 MG tablet Take 5 mg by mouth daily as needed for erectile dysfunction.     Lisinopril No family history on file. Social History:   reports that he quit smoking about 3 weeks ago. He quit after 2.00 years of use. He has never used smokeless tobacco. He reports that he drinks alcohol. He reports that he does not use drugs.   REVIEW OF SYSTEMS : Negative except for see problem list  Physical Exam:   There were no vitals taken for this visit. There is no height or weight on file to calculate BMI.  Gen:  WDWN AAM NAD  Neurological: Alert and oriented to person, place, and time. Motor and sensory function is grossly intact  Head: Normocephalic and atraumatic.  Eyes: Conjunctivae are normal. Pupils are equal, round, and reactive to light. No  scleral icterus.  Neck: Normal range of motion. Neck supple. No tracheal deviation or thyromegaly present.  Cardiovascular:  SR without murmurs or gallops.  No carotid bruits Breast:  Not examined Respiratory: Effort normal.  No respiratory distress. No chest wall tenderness. Breath sounds normal.  No wheezes, rales or rhonchi.  Abdomen:  Obese and nontender GU:  unremarkable Musculoskeletal: Normal range of motion. Extremities are nontender. No cyanosis, edema or clubbing noted Lymphadenopathy: No cervical, preauricular, postauricular or axillary adenopathy is present Skin: Skin is warm and dry. No rash noted. No diaphoresis. No erythema. No pallor. Pscyh: Normal mood and affect. Behavior is normal. Judgment and thought content normal.   LABORATORY RESULTS: No results found for this or any previous visit (from the past 48 hour(s)).   RADIOLOGY RESULTS: No results found.  Problem List: Patient Active Problem List   Diagnosis Date Noted  . Morbid obesity ([place]) 07/21/2017  . Essential hypertension 07/21/2017  . Bilateral knee pain 07/21/2017  . Erectile dysfunction 07/21/2017  . CTS (carpal tunnel syndrome) 01/10/2013    Assessment & Plan: BMI 55 man for sleeve gastrectomy.  Informed consent has been obtained and he is ready for surgery.       Matt B. Hassell Done, MD, [facility] [phone] beeper [phone]  09/14/2017 8:14 PM

## 2017-09-15 ENCOUNTER — Inpatient Hospital Stay (HOSPITAL_COMMUNITY)
Admission: RE | Admit: 2017-09-15 | Discharge: 2017-09-16 | DRG: 621 | Disposition: A | Discharge status: Home / Self Care | Payer: 59 | Source: Ambulatory Visit | Attending: Surgery | Admitting: Surgery

## 2017-09-15 ENCOUNTER — Encounter (HOSPITAL_COMMUNITY): Payer: Self-pay | Admitting: Emergency Medicine

## 2017-09-15 ENCOUNTER — Other Ambulatory Visit: Payer: Self-pay

## 2017-09-15 ENCOUNTER — Inpatient Hospital Stay (HOSPITAL_COMMUNITY): Payer: 59 | Admitting: Anesthesiology

## 2017-09-15 ENCOUNTER — Encounter (HOSPITAL_COMMUNITY)
Admission: RE | Disposition: A | Discharge status: Home / Self Care | Payer: Self-pay | Source: Ambulatory Visit | Attending: Surgery

## 2017-09-15 DIAGNOSIS — Z9884 Bariatric surgery status: Secondary | ICD-10-CM

## 2017-09-15 DIAGNOSIS — Z87891 Personal history of nicotine dependence: Secondary | ICD-10-CM

## 2017-09-15 DIAGNOSIS — I10 Essential (primary) hypertension: Secondary | ICD-10-CM | POA: Diagnosis not present

## 2017-09-15 DIAGNOSIS — Z6841 Body Mass Index (BMI) 40.0 and over, adult: Secondary | ICD-10-CM

## 2017-09-15 DIAGNOSIS — Z79899 Other long term (current) drug therapy: Secondary | ICD-10-CM | POA: Diagnosis not present

## 2017-09-15 DIAGNOSIS — G4730 Sleep apnea, unspecified: Secondary | ICD-10-CM | POA: Diagnosis not present

## 2017-09-15 DIAGNOSIS — G4733 Obstructive sleep apnea (adult) (pediatric): Secondary | ICD-10-CM | POA: Diagnosis not present

## 2017-09-15 DIAGNOSIS — G5600 Carpal tunnel syndrome, unspecified upper limb: Secondary | ICD-10-CM | POA: Diagnosis not present

## 2017-09-15 HISTORY — PX: LAPAROSCOPIC GASTRIC SLEEVE RESECTION: SHX5895

## 2017-09-15 LAB — COMPREHENSIVE METABOLIC PANEL
ALBUMIN: 3.9 g/dL (ref 3.5–5.0)
ALT: 40 U/L (ref 17–63)
AST: 42 U/L — AB (ref 15–41)
Alkaline Phosphatase: 83 U/L (ref 38–126)
Anion gap: 11 (ref 5–15)
BUN: 12 mg/dL (ref 6–20)
CHLORIDE: 103 mmol/L (ref 101–111)
CO2: 24 mmol/L (ref 22–32)
Calcium: 9 mg/dL (ref 8.9–10.3)
Creatinine, Ser: 1.21 mg/dL (ref 0.61–1.24)
GFR calc Af Amer: >60 mL/min (ref >60)
GFR calc non Af Amer: >60 mL/min (ref >60)
GLUCOSE: 121 mg/dL — AB (ref 65–99)
Potassium: 4.2 mmol/L (ref 3.5–5.1)
Sodium: 138 mmol/L (ref 135–145)
Total Bilirubin: 1.2 mg/dL (ref 0.3–1.2)
Total Protein: 7.7 g/dL (ref 6.5–8.1)

## 2017-09-15 LAB — TYPE AND SCREEN
ABO/RH(D): A POS
ANTIBODY SCREEN: NEGATIVE

## 2017-09-15 LAB — CBC WITH DIFFERENTIAL/PLATELET
BASOS ABS: 0 10*3/uL (ref 0.0–0.1)
BASOS PCT: 0 %
Eosinophils Absolute: 0.1 10*3/uL (ref 0.0–0.7)
Eosinophils Relative: 1 %
HCT: 42.1 % (ref 39.0–52.0)
HEMOGLOBIN: 14.4 g/dL (ref 13.0–17.0)
LYMPHS PCT: 33 %
Lymphs Abs: 2 10*3/uL (ref 0.7–4.0)
MCH: 29.9 pg (ref 26.0–34.0)
MCHC: 34.2 g/dL (ref 30.0–36.0)
MCV: 87.3 fL (ref 78.0–100.0)
MONOS PCT: 9 %
Monocytes Absolute: 0.6 10*3/uL (ref 0.1–1.0)
NEUTROS ABS: 3.6 10*3/uL (ref 1.7–7.7)
NEUTROS PCT: 57 %
Platelets: 179 10*3/uL (ref 150–400)
RBC: 4.82 MIL/uL (ref 4.22–5.81)
RDW: 13.5 % (ref 11.5–15.5)
WBC: 6.3 10*3/uL (ref 4.0–10.5)

## 2017-09-15 LAB — HEMOGLOBIN AND HEMATOCRIT, BLOOD
HCT: 43.9 % (ref 39.0–52.0)
Hemoglobin: 15.1 g/dL (ref 13.0–17.0)

## 2017-09-15 LAB — ABO/RH: ABO/RH(D): A POS

## 2017-09-15 SURGERY — GASTRECTOMY, SLEEVE, LAPAROSCOPIC
Anesthesia: General | Site: Abdomen

## 2017-09-15 MED ORDER — PHENYLEPHRINE 40 MCG/ML (10ML) SYRINGE FOR IV PUSH (FOR BLOOD PRESSURE SUPPORT)
PREFILLED_SYRINGE | INTRAVENOUS | Status: DC | PRN
  Administered 2017-09-15 (×3): 40 ug via INTRAVENOUS

## 2017-09-15 MED ORDER — MIDAZOLAM HCL 2 MG/2ML IJ SOLN
INTRAMUSCULAR | Status: AC
  Filled 2017-09-15: qty 2

## 2017-09-15 MED ORDER — SCOPOLAMINE 1 MG/3DAYS TD PT72
1.0000 | MEDICATED_PATCH | TRANSDERMAL | Status: DC
  Administered 2017-09-15: 1.5 mg via TRANSDERMAL
  Filled 2017-09-15: qty 1

## 2017-09-15 MED ORDER — SUCCINYLCHOLINE CHLORIDE 20 MG/ML IJ SOLN
INTRAMUSCULAR | Status: DC | PRN
  Administered 2017-09-15: 140 mg via INTRAVENOUS

## 2017-09-15 MED ORDER — ONDANSETRON HCL 4 MG/2ML IJ SOLN
INTRAMUSCULAR | Status: AC
  Filled 2017-09-15: qty 2

## 2017-09-15 MED ORDER — GABAPENTIN 300 MG PO CAPS
300.0000 mg | ORAL_CAPSULE | ORAL | Status: AC
  Administered 2017-09-15: 300 mg via ORAL
  Filled 2017-09-15: qty 1

## 2017-09-15 MED ORDER — LIDOCAINE 2% (20 MG/ML) 5 ML SYRINGE
INTRAMUSCULAR | Status: DC | PRN
  Administered 2017-09-15: 1.5 mg/kg/h via INTRAVENOUS

## 2017-09-15 MED ORDER — HEPARIN SODIUM (PORCINE) 5000 UNIT/ML IJ SOLN
5000.0000 [IU] | INTRAMUSCULAR | Status: AC
  Administered 2017-09-15: 5000 [IU] via SUBCUTANEOUS
  Filled 2017-09-15: qty 1

## 2017-09-15 MED ORDER — ONDANSETRON HCL 4 MG/2ML IJ SOLN
INTRAMUSCULAR | Status: DC | PRN
  Administered 2017-09-15: 4 mg via INTRAVENOUS

## 2017-09-15 MED ORDER — EPHEDRINE SULFATE-NACL 50-0.9 MG/10ML-% IV SOSY
PREFILLED_SYRINGE | INTRAVENOUS | Status: DC | PRN
  Administered 2017-09-15: 10 mg via INTRAVENOUS
  Administered 2017-09-15 (×2): 15 mg via INTRAVENOUS
  Administered 2017-09-15: 10 mg via INTRAVENOUS

## 2017-09-15 MED ORDER — SODIUM CHLORIDE 0.9 % IJ SOLN
INTRAMUSCULAR | Status: AC
  Filled 2017-09-15: qty 10

## 2017-09-15 MED ORDER — ROCURONIUM BROMIDE 100 MG/10ML IV SOLN
INTRAVENOUS | Status: DC | PRN
  Administered 2017-09-15: 60 mg via INTRAVENOUS
  Administered 2017-09-15 (×2): 10 mg via INTRAVENOUS

## 2017-09-15 MED ORDER — HEPARIN SODIUM (PORCINE) 5000 UNIT/ML IJ SOLN
5000.0000 [IU] | Freq: Three times a day (TID) | INTRAMUSCULAR | Status: DC
Start: 2017-09-15 — End: 2017-09-16
  Administered 2017-09-15 – 2017-09-16 (×2): 5000 [IU] via SUBCUTANEOUS
  Filled 2017-09-15 (×2): qty 1

## 2017-09-15 MED ORDER — HYDRALAZINE HCL 20 MG/ML IJ SOLN
10.0000 mg | INTRAMUSCULAR | Status: DC | PRN
  Administered 2017-09-15: 10 mg via INTRAVENOUS
  Filled 2017-09-15: qty 1

## 2017-09-15 MED ORDER — DEXAMETHASONE SODIUM PHOSPHATE 4 MG/ML IJ SOLN
4.0000 mg | INTRAMUSCULAR | Status: AC
  Administered 2017-09-15: 10 mg via INTRAVENOUS

## 2017-09-15 MED ORDER — OXYCODONE HCL 5 MG/5ML PO SOLN
5.0000 mg | ORAL | Status: DC | PRN
  Filled 2017-09-15 (×2): qty 5

## 2017-09-15 MED ORDER — KETAMINE HCL 10 MG/ML IJ SOLN
INTRAMUSCULAR | Status: AC
  Filled 2017-09-15: qty 1

## 2017-09-15 MED ORDER — FENTANYL CITRATE (PF) 250 MCG/5ML IJ SOLN
INTRAMUSCULAR | Status: AC
  Filled 2017-09-15: qty 5

## 2017-09-15 MED ORDER — CHLORHEXIDINE GLUCONATE CLOTH 2 % EX PADS: 6.0000 | MEDICATED_PAD | Freq: Once | CUTANEOUS | Status: DC

## 2017-09-15 MED ORDER — KCL IN DEXTROSE-NACL 20-5-0.45 MEQ/L-%-% IV SOLN
INTRAVENOUS | Status: DC
  Administered 2017-09-15: 18:00:00 via INTRAVENOUS
  Administered 2017-09-16: 100 mL/h via INTRAVENOUS
  Filled 2017-09-15 (×2): qty 1000

## 2017-09-15 MED ORDER — LACTATED RINGERS IV SOLN: INTRAVENOUS | Status: DC

## 2017-09-15 MED ORDER — FENTANYL CITRATE (PF) 100 MCG/2ML IJ SOLN
INTRAMUSCULAR | Status: DC | PRN
  Administered 2017-09-15: 150 ug via INTRAVENOUS

## 2017-09-15 MED ORDER — LIDOCAINE 2% (20 MG/ML) 5 ML SYRINGE
INTRAMUSCULAR | Status: AC
  Filled 2017-09-15: qty 5

## 2017-09-15 MED ORDER — LIDOCAINE HCL 2 % IJ SOLN
INTRAMUSCULAR | Status: AC
  Filled 2017-09-15: qty 20

## 2017-09-15 MED ORDER — ACETAMINOPHEN 500 MG PO TABS
1000.0000 mg | ORAL_TABLET | ORAL | Status: AC
  Administered 2017-09-15: 1000 mg via ORAL
  Filled 2017-09-15: qty 2

## 2017-09-15 MED ORDER — ACETAMINOPHEN 160 MG/5ML PO SOLN
650.0000 mg | Freq: Four times a day (QID) | ORAL | Status: DC
  Administered 2017-09-15 – 2017-09-16 (×4): 650 mg via ORAL
  Filled 2017-09-15 (×4): qty 20.3

## 2017-09-15 MED ORDER — ONDANSETRON HCL 4 MG/2ML IJ SOLN: 4.0000 mg | INTRAMUSCULAR | Status: DC | PRN

## 2017-09-15 MED ORDER — SUGAMMADEX SODIUM 500 MG/5ML IV SOLN
INTRAVENOUS | Status: DC | PRN
  Administered 2017-09-15: 300 mg via INTRAVENOUS

## 2017-09-15 MED ORDER — CELECOXIB 200 MG PO CAPS
400.0000 mg | ORAL_CAPSULE | ORAL | Status: AC
  Administered 2017-09-15: 400 mg via ORAL
  Filled 2017-09-15: qty 2

## 2017-09-15 MED ORDER — SUCCINYLCHOLINE CHLORIDE 200 MG/10ML IV SOSY
PREFILLED_SYRINGE | INTRAVENOUS | Status: AC
  Filled 2017-09-15: qty 10

## 2017-09-15 MED ORDER — PREMIER PROTEIN SHAKE
2.0000 [oz_av] | ORAL | Status: DC
  Administered 2017-09-16 (×2): 2 [oz_av] via ORAL

## 2017-09-15 MED ORDER — MEPERIDINE HCL 50 MG/ML IJ SOLN: 6.2500 mg | INTRAMUSCULAR | Status: DC | PRN

## 2017-09-15 MED ORDER — APREPITANT 40 MG PO CAPS
40.0000 mg | ORAL_CAPSULE | ORAL | Status: AC
  Administered 2017-09-15: 40 mg via ORAL
  Filled 2017-09-15: qty 1

## 2017-09-15 MED ORDER — ROCURONIUM BROMIDE 10 MG/ML (PF) SYRINGE
PREFILLED_SYRINGE | INTRAVENOUS | Status: AC
  Filled 2017-09-15: qty 5

## 2017-09-15 MED ORDER — SODIUM CHLORIDE 0.9 % IV SOLN
INTRAVENOUS | Status: AC
  Filled 2017-09-15: qty 2

## 2017-09-15 MED ORDER — KETAMINE HCL 10 MG/ML IJ SOLN
INTRAMUSCULAR | Status: DC | PRN
  Administered 2017-09-15: 40 mg via INTRAVENOUS

## 2017-09-15 MED ORDER — FENTANYL CITRATE (PF) 100 MCG/2ML IJ SOLN: 25.0000 ug | INTRAMUSCULAR | Status: DC | PRN

## 2017-09-15 MED ORDER — MORPHINE SULFATE (PF) 2 MG/ML IV SOLN: 1.0000 mg | INTRAVENOUS | Status: DC | PRN

## 2017-09-15 MED ORDER — PROPOFOL 10 MG/ML IV BOLUS
INTRAVENOUS | Status: AC
  Filled 2017-09-15: qty 20

## 2017-09-15 MED ORDER — BUPIVACAINE-EPINEPHRINE (PF) 0.25% -1:200000 IJ SOLN
INTRAMUSCULAR | Status: AC
  Filled 2017-09-15: qty 30

## 2017-09-15 MED ORDER — FAMOTIDINE IN NACL 20-0.9 MG/50ML-% IV SOLN
20.0000 mg | Freq: Two times a day (BID) | INTRAVENOUS | Status: DC
  Administered 2017-09-15 – 2017-09-16 (×2): 20 mg via INTRAVENOUS
  Filled 2017-09-15 (×2): qty 50

## 2017-09-15 MED ORDER — LACTATED RINGERS IV SOLN
INTRAVENOUS | Status: DC
  Administered 2017-09-15 (×2): via INTRAVENOUS

## 2017-09-15 MED ORDER — BUPIVACAINE LIPOSOME 1.3 % IJ SUSP
20.0000 mL | Freq: Once | INTRAMUSCULAR | Status: DC
  Filled 2017-09-15: qty 20

## 2017-09-15 MED ORDER — DEXAMETHASONE SODIUM PHOSPHATE 10 MG/ML IJ SOLN
INTRAMUSCULAR | Status: AC
  Filled 2017-09-15: qty 1

## 2017-09-15 MED ORDER — MIDAZOLAM HCL 5 MG/5ML IJ SOLN
INTRAMUSCULAR | Status: DC | PRN
  Administered 2017-09-15 (×2): 1 mg via INTRAVENOUS

## 2017-09-15 MED ORDER — 0.9 % SODIUM CHLORIDE (POUR BTL) OPTIME
TOPICAL | Status: DC | PRN
Start: 2017-09-15 — End: 2017-09-15
  Administered 2017-09-15: 1000 mL

## 2017-09-15 MED ORDER — PROMETHAZINE HCL 25 MG/ML IJ SOLN: 6.2500 mg | INTRAMUSCULAR | Status: DC | PRN

## 2017-09-15 MED ORDER — CEFOTETAN DISODIUM-DEXTROSE 2-2.08 GM-%(50ML) IV SOLR
2.0000 g | INTRAVENOUS | Status: AC
  Administered 2017-09-15: 2 g via INTRAVENOUS

## 2017-09-15 MED ORDER — BUPIVACAINE LIPOSOME 1.3 % IJ SUSP
INTRAMUSCULAR | Status: DC | PRN
  Administered 2017-09-15: 20 mL

## 2017-09-15 MED ORDER — SODIUM CHLORIDE 0.9 % IJ SOLN
INTRAMUSCULAR | Status: DC | PRN
  Administered 2017-09-15: 10 mL via INTRAVENOUS

## 2017-09-15 MED ORDER — LIDOCAINE HCL (CARDIAC) PF 100 MG/5ML IV SOSY
PREFILLED_SYRINGE | INTRAVENOUS | Status: DC | PRN
  Administered 2017-09-15: 60 mg via INTRAVENOUS

## 2017-09-15 MED ORDER — PROPOFOL 10 MG/ML IV BOLUS
INTRAVENOUS | Status: DC | PRN
  Administered 2017-09-15: 200 mg via INTRAVENOUS

## 2017-09-15 SURGICAL SUPPLY — 65 items
ADH SKN CLS APL DERMABOND .7 (GAUZE/BANDAGES/DRESSINGS) ×1
APPLICATOR COTTON TIP 6IN STRL (MISCELLANEOUS) ×2 IMPLANT
APPLIER CLIP 5 13 M/L LIGAMAX5 (MISCELLANEOUS)
APPLIER CLIP ROT 10 11.4 M/L (STAPLE)
APPLIER CLIP ROT 13.4 12 LRG (CLIP)
APR CLP LRG 13.4X12 ROT 20 MLT (CLIP)
APR CLP MED LRG 11.4X10 (STAPLE)
APR CLP MED LRG 5 ANG JAW (MISCELLANEOUS)
BAG LAPAROSCOPIC 12 15 PORT 16 (BASKET) IMPLANT
BAG RETRIEVAL 12/15 (BASKET) ×2
BLADE SURG 15 STRL LF DISP TIS (BLADE) ×1 IMPLANT
BLADE SURG 15 STRL SS (BLADE) ×2
CABLE HIGH FREQUENCY MONO STRZ (ELECTRODE) ×2 IMPLANT
CLIP APPLIE 5 13 M/L LIGAMAX5 (MISCELLANEOUS) IMPLANT
CLIP APPLIE ROT 10 11.4 M/L (STAPLE) IMPLANT
CLIP APPLIE ROT 13.4 12 LRG (CLIP) IMPLANT
DERMABOND ADVANCED (GAUZE/BANDAGES/DRESSINGS) ×1
DERMABOND ADVANCED .7 DNX12 (GAUZE/BANDAGES/DRESSINGS) IMPLANT
DEVICE SUT QUICK LOAD TK 5 (STAPLE) IMPLANT
DEVICE SUT TI-KNOT TK 5X26 (MISCELLANEOUS) IMPLANT
DEVICE SUTURE ENDOST 10MM (ENDOMECHANICALS) IMPLANT
DISSECTOR BLUNT TIP ENDO 5MM (MISCELLANEOUS) IMPLANT
DRSG TEGADERM 2-3/8X2-3/4 SM (GAUZE/BANDAGES/DRESSINGS) ×1 IMPLANT
ELECT REM PT RETURN 15FT ADLT (MISCELLANEOUS) ×2 IMPLANT
GAUZE SPONGE 2X2 8PLY STRL LF (GAUZE/BANDAGES/DRESSINGS) IMPLANT
GAUZE SPONGE 4X4 12PLY STRL (GAUZE/BANDAGES/DRESSINGS) IMPLANT
GLOVE BIOGEL M 8.0 STRL (GLOVE) ×2 IMPLANT
GOWN STRL REUS W/TWL XL LVL3 (GOWN DISPOSABLE) ×8 IMPLANT
GRASPER SUT TROCAR 14GX15 (MISCELLANEOUS) ×2 IMPLANT
HANDLE STAPLE EGIA 4 XL (STAPLE) ×2 IMPLANT
HOVERMATT SINGLE USE (MISCELLANEOUS) ×2 IMPLANT
KIT BASIN OR (CUSTOM PROCEDURE TRAY) ×2 IMPLANT
MARKER SKIN DUAL TIP RULER LAB (MISCELLANEOUS) ×2 IMPLANT
NEEDLE SPNL 22GX3.5 QUINCKE BK (NEEDLE) ×2 IMPLANT
PACK UNIVERSAL I (CUSTOM PROCEDURE TRAY) ×2 IMPLANT
RELOAD TRI 45 ART MED THCK BLK (STAPLE) ×2 IMPLANT
RELOAD TRI 45 ART MED THCK PUR (STAPLE) ×2 IMPLANT
RELOAD TRI 60 ART MED THCK BLK (STAPLE) ×2 IMPLANT
RELOAD TRI 60 ART MED THCK PUR (STAPLE) ×2 IMPLANT
SCISSORS LAP 5X45 EPIX DISP (ENDOMECHANICALS) ×1 IMPLANT
SET IRRIG TUBING LAPAROSCOPIC (IRRIGATION / IRRIGATOR) ×2 IMPLANT
SHEARS HARMONIC ACE PLUS 45CM (MISCELLANEOUS) ×2 IMPLANT
SLEEVE ADV FIXATION 5X100MM (TROCAR) ×4 IMPLANT
SLEEVE GASTRECTOMY 36FR VISIGI (MISCELLANEOUS) ×2 IMPLANT
SOLUTION ANTI FOG 6CC (MISCELLANEOUS) ×2 IMPLANT
SPONGE GAUZE 2X2 STER 10/PKG (GAUZE/BANDAGES/DRESSINGS) ×1
SPONGE LAP 18X18 RF (DISPOSABLE) ×2 IMPLANT
STAPLER VISISTAT 35W (STAPLE) ×2 IMPLANT
SUT MNCRL AB 4-0 PS2 18 (SUTURE) ×2 IMPLANT
SUT SURGIDAC NAB ES-9 0 48 120 (SUTURE) IMPLANT
SUT VIC AB 4-0 SH 18 (SUTURE) IMPLANT
SUT VICRYL 0 TIES 12 18 (SUTURE) ×2 IMPLANT
SYR 10ML ECCENTRIC (SYRINGE) ×2 IMPLANT
SYR 20CC LL (SYRINGE) ×2 IMPLANT
SYR 50ML LL SCALE MARK (SYRINGE) ×2 IMPLANT
TOWEL OR 17X26 10 PK STRL BLUE (TOWEL DISPOSABLE) ×4 IMPLANT
TOWEL OR NON WOVEN STRL DISP B (DISPOSABLE) ×2 IMPLANT
TRAY FOLEY MTR SLVR 16FR STAT (SET/KITS/TRAYS/PACK) IMPLANT
TROCAR ADV FIXATION 5X100MM (TROCAR) ×2 IMPLANT
TROCAR BLADELESS 15MM (ENDOMECHANICALS) ×2 IMPLANT
TROCAR BLADELESS OPT 5 100 (ENDOMECHANICALS) ×2 IMPLANT
TUBE CALIBRATION LAPBAND (TUBING) IMPLANT
TUBING CONNECTING 10 (TUBING) ×4 IMPLANT
TUBING ENDO SMARTCAP (MISCELLANEOUS) ×2 IMPLANT
TUBING INSUF HEATED (TUBING) ×2 IMPLANT

## 2017-09-15 NOTE — Op Note (Signed)
Preoperative diagnosis: laparoscopic sleeve gastrectomy  Postoperative diagnosis: Same   Procedure: Upper endoscopy   Surgeon: Clovis Riley, M.D.  Anesthesia: Gen.   Description of procedure: The endoscopy was placed in the mouth and into the oropharynx and under endoscopic vision it was advanced to the esophagogastric junction.  The pouch was insufflated and no bleeding or bubbles were seen.  The GEJ was identified at 44cm from the teeth. No undue angulation or narrowing of the lumen specifically at the incisura angularis was present. No bleeding or leaks were detected. The scope was withdrawn without difficulty.    Clovis Riley, M.D. General, Bariatric, & Minimally Invasive Surgery [facility]

## 2017-09-15 NOTE — Progress Notes (Signed)
Patient refuses CPAP for tonight.  

## 2017-09-15 NOTE — Anesthesia Preprocedure Evaluation (Addendum)
Anesthesia Evaluation  Patient identified by MRN, date of birth, ID band Patient awake    Reviewed: Allergy & Precautions, NPO status , Patient's Chart, lab work & pertinent test results  History of Anesthesia Complications (+) AWARENESS UNDER ANESTHESIA and history of anesthetic complications  Airway Mallampati: IV  TM Distance: >3 FB Neck ROM: Full    Dental  (+) Dental Advisory Given, Chipped,    Pulmonary sleep apnea , former smoker,    breath sounds clear to auscultation       Cardiovascular hypertension, Pt. on medications  Rhythm:Regular Rate:Normal     Neuro/Psych  Neuromuscular disease negative psych ROS   GI/Hepatic negative GI ROS, Neg liver ROS,   Endo/Other  negative endocrine ROS  Renal/GU negative Renal ROS     Musculoskeletal   Abdominal (+) + obese,   Peds  Hematology negative hematology ROS (+)   Anesthesia Other Findings   Reproductive/Obstetrics                            Lab Results  Component Value Date   WBC 6.3 09/15/2017   HGB 14.4 09/15/2017   HCT 42.1 09/15/2017   MCV 87.3 09/15/2017   PLT 179 09/15/2017   Lab Results  Component Value Date   CREATININE 1.28 (H) 09/09/2017   BUN 15 09/09/2017   NA 136 09/09/2017   K 4.5 09/09/2017   CL 102 09/09/2017   CO2 26 09/09/2017   No results found for: INR, PROTIME  EKG: normal sinus rhythm, prolonged QT.  Anesthesia Physical Anesthesia Plan  ASA: III  Anesthesia Plan: General   Post-op Pain Management:    Induction: Intravenous  PONV Risk Score and Plan: 3 and Ondansetron, Dexamethasone and Midazolam  Airway Management Planned: Oral ETT  Additional Equipment: None  Intra-op Plan:   Post-operative Plan: Extubation in OR  Informed Consent: I have reviewed the patients History and Physical, chart, labs and discussed the procedure including the risks, benefits and alternatives for the proposed  anesthesia with the patient or authorized representative who has indicated his/her understanding and acceptance.   Dental advisory given  Plan Discussed with:   Anesthesia Plan Comments: (BIS Monitor. )       Anesthesia Quick Evaluation

## 2017-09-15 NOTE — Op Note (Signed)
Surgeon: Kaylyn Lim, MD, FACS  Asst:  Romana Juniper, MD  Anes:  General endotracheal  Procedure: Laparoscopic sleeve gastrectomy and upper endoscopy  Diagnosis: Morbid obesity  Complications: None noted  EBL:   minimal cc  Description of Procedure:  The patient was take to OR 2 and given general anesthesia.  The abdomen was prepped with Technicare and draped sterilely.  A timeout was performed.  Access to the abdomen was achieved with a 5 mm Optiview through a left upper quadrant approach without difficulty.  Following insufflation, the state of the abdomen was found to be free of adhesions.  The ViSiGi 36Fr tube was inserted to deflate the stomach and was pulled back into the esophagus.    The pylorus was identified and we measured 6 cm back and marked the antrum.  At that point we began dissection to take down the greater curvature of the stomach using the Harmonic scalpel.  This dissection was taken all the way up to the left crus.  Posterior attachments of the stomach were also taken down.    The ViSiGi tube was then passed into the antrum and suction applied so that it was snug along the lessor curvature.  The "crow's foot" or incisura was identified.  The sleeve gastrectomy was begun using the [employer] stapler beginning with a 4.5 cm stapler which was down a bit from the 6 cm mark.  This was followed by a 6 cm black with tRS and multiple purple applications with TRS.    When the sleeve was complete the tube was taken off suction and insufflated briefly.  The tube was withdrawn.  Upper endoscopy was then performed by Dr. Kae Heller.     The specimen was extracted through the 15 trocar site using the bag technique. A single 0 vicryl used to approximate the 15 mm port.   A TAP block was performed with 28 cc of Exparel and 2 cc in the [place] port.  The incisions were closed with 4-0 monocryl and Dermabond.   Matt B. Hassell Done, [place], [facility], [place]

## 2017-09-15 NOTE — Progress Notes (Signed)
Discussed post op day goals with patient including ambulation, IS, diet progression, pain, and nausea control.  Questions answered. 

## 2017-09-15 NOTE — Anesthesia Postprocedure Evaluation (Signed)
Anesthesia Post Note  Patient: Javier Joyce  Procedure(s) Performed: LAPAROSCOPIC GASTRIC SLEEVE RESECTION WITH UPPER ENDO AND ERAS PATHWAY (N/A Abdomen)     Patient location during evaluation: PACU Anesthesia Type: General Level of consciousness: awake and alert Pain management: pain level controlled Vital Signs Assessment: post-procedure vital signs reviewed and stable Respiratory status: spontaneous breathing, nonlabored ventilation, respiratory function stable and patient connected to nasal cannula oxygen Cardiovascular status: blood pressure returned to baseline and stable Postop Assessment: no apparent nausea or vomiting Anesthetic complications: no    Last Vitals:  Vitals:   09/15/17 1344 09/15/17 1431  BP: (!) 170/90 (!) 150/84  Pulse: 96 99  Resp: 16 16  Temp: 37 C 36.9 C  SpO2: 94% 96%    Last Pain:  Vitals:   09/15/17 1431  TempSrc: Oral  PainSc:                  Effie Berkshire

## 2017-09-15 NOTE — Discharge Instructions (Signed)
° ° ° °GASTRIC BYPASS/SLEEVE ° Home Care Instructions ° ° These instructions are to help you care for yourself when you go home. ° °Call: If you have any problems. °• Call [phone] and ask for the surgeon on call °• If you need immediate help, come to the ER at [facility].  °• Tell the ER staff that you are a new post-op gastric bypass or gastric sleeve patient °  °Signs and symptoms to report: • Severe vomiting or nausea °o If you cannot keep down clear liquids for longer than 1 day, call your surgeon  °• Abdominal pain that does not get better after taking your pain medication °• Fever over 100.4° F with chills °• Heart beating over 100 beats a minute °• Shortness of breath at rest °• Chest pain °•  Redness, swelling, drainage, or foul odor at incision (surgical) sites °•  If your incisions open or pull apart °• Swelling or pain in calf (lower leg) °• Diarrhea (Loose bowel movements that happen often), frequent watery, uncontrolled bowel movements °• Constipation, (no bowel movements for 3 days) if this happens: Pick one °o Milk of Magnesia, 2 tablespoons by mouth, 3 times a day for 2 days if needed °o Stop taking Milk of Magnesia once you have a bowel movement °o Call your doctor if constipation continues °Or °o Miralax  (instead of Milk of Magnesia) following the label instructions °o Stop taking Miralax once you have a bowel movement °o Call your doctor if constipation continues °• Anything you think is not normal °  °Normal side effects after surgery: • Unable to sleep at night or unable to focus °• Irritability or moody °• Being tearful (crying) or depressed °These are common complaints, possibly related to your anesthesia medications that put you to sleep, stress of surgery, and change in lifestyle.  This usually goes away a few weeks after surgery.  If these feelings continue, call your primary care doctor. °  °Wound Care: You may have surgical glue, steri-strips, or staples over your incisions after  surgery °• Surgical glue:  Looks like a clear film over your incisions and will wear off a little at a time °• Steri-strips: Strips of tape over your incisions. You may notice a yellowish color on the skin under the steri-strips. This is used to make the   steri-strips stick better. Do not pull the steri-strips off - let them fall off °• Staples: Staples may be removed before you leave the hospital °o If you go home with staples, call [facility], [phone] at for an appointment with your surgeon’s nurse to have staples removed 10 days after surgery. °• Showering: You may shower two (2) days after your surgery unless your surgeon tells you differently °o Wash gently around incisions with warm soapy water, rinse well, and gently pat dry  °o No tub baths until staples are removed, steri-strips fall off or glue is gone.  °  °Medications: • Medications should be liquid or crushed if larger than the size of a dime °• Extended release pills (medication that release a little bit at a time through the day) should NOT be crushed or cut. (examples include XL, ER, DR, SR) °• Depending on the size and number of medications you take, you may need to space (take a few throughout the day)/change the time you take your medications so that you do not over-fill your pouch (smaller stomach) °• Make sure you follow-up with your primary care doctor to   make medication changes needed during rapid weight loss and life-style changes °• If you have diabetes, follow up with the doctor that orders your diabetes medication(s) within one week after surgery and check your blood sugar regularly. °• Do not drive while taking prescription pain medication  °• It is ok to take Tylenol by the bottle instructions with your pain medicine or instead of your pain medicine as needed.  DO NOT TAKE NSAIDS (EXAMPLES OF NSAIDS:  IBUPROFREN/ NAPROXEN)  °Diet:                    First 2 Weeks ° You will see the dietician t about two (2) weeks  after your surgery. The dietician will increase the types of foods you can eat if you are handling liquids well: °• If you have severe vomiting or nausea and cannot keep down clear liquids lasting longer than 1 day, call your surgeon @ ([phone]) °Protein Shake °• Drink at least 2 ounces of shake 5-6 times per day °• Each serving of protein shakes (usually 8 - 12 ounces) should have: °o 15 grams of protein  °o And no more than 5 grams of carbohydrate  °• Goal for protein each day: °o Men = 80 grams per day °o Women = 60 grams per day °• Protein powder may be added to fluids such as non-fat milk or Lactaid milk or unsweetened Soy/Almond milk (limit to 35 grams added protein powder per serving) ° °Hydration °• Slowly increase the amount of water and other clear liquids as tolerated (See Acceptable Fluids) °• Slowly increase the amount of protein shake as tolerated  °•  Sip fluids slowly and throughout the day.  Do not use straws. °• May use sugar substitutes in small amounts (no more than 6 - 8 packets per day; i.e. Splenda) ° °Fluid Goal °• The first goal is to drink at least 8 ounces of protein shake/drink per day (or as directed by the nutritionist); some examples of protein shakes are Syntrax Nectar, Adkins Advantage, EAS Edge HP, and Unjury. See handout from pre-op Bariatric Education Class: °o Slowly increase the amount of protein shake you drink as tolerated °o You may find it easier to slowly sip shakes throughout the day °o It is important to get your proteins in first °• Your fluid goal is to drink 64 - 100 ounces of fluid daily °o It may take a few weeks to build up to this °• 32 oz (or more) should be clear liquids  °And  °• 32 oz (or more) should be full liquids (see below for examples) °• Liquids should not contain sugar, caffeine, or carbonation ° °Clear Liquids: °• Water or Sugar-free flavored water (i.e. Fruit H2O, Propel) °• Decaffeinated coffee or tea (sugar-free) °• Crystal Lite, Wyler’s Lite,  Minute Maid Lite °• Sugar-free Jell-O °• Bouillon or broth °• Sugar-free Popsicle:   *Less than 20 calories each; Limit 1 per day ° °Full Liquids: °Protein Shakes/Drinks + 2 choices per day of other full liquids °• Full liquids must be: °o No More Than 15 grams of Carbs per serving  °o No More Than 3 grams of Fat per serving °• Strained low-fat cream soup (except Cream of Potato or Tomato) °• Non-Fat milk °• Fat-free Lactaid Milk °• Unsweetened Soy Or Unsweetened Almond Milk °• Low Sugar yogurt (Dannon Lite & Fit, Greek yogurt; Oikos Triple Zero; Chobani Simply 100; Yoplait 100 calorie Greek - No Fruit on the Bottom) ° °  °Vitamins   and Minerals • Start 1 day after surgery unless otherwise directed by your surgeon °• 2 Chewable Bariatric Specific Multivitamin / Multimineral Supplement with iron (Example: Bariatric Advantage Multi EA) °• Chewable Calcium with Vitamin D-3 °(Example: 3 Chewable Calcium Plus 600 with Vitamin D-3) °o Take 500 mg three (3) times a day for a total of 1500 mg each day °o Do not take all 3 doses of calcium at one time as it may cause constipation, and you can only absorb 500 mg  at a time  °o Do not mix multivitamins containing iron with calcium supplements; take 2 hours apart °• Menstruating women and those with a history of anemia (a blood disease that causes weakness) may need extra iron °o Talk with your doctor to see if you need more iron °• Do not stop taking or change any vitamins or minerals until you talk to your dietitian or surgeon °• Your Dietitian and/or surgeon must approve all vitamin and mineral supplements °  °Activity and Exercise: Limit your physical activity as instructed by your doctor.  It is important to continue walking at home.  During this time, use these guidelines: °• Do not lift anything greater than ten (10) pounds for at least two (2) weeks °• Do not go back to work or drive until your surgeon says you can °• You may have sex when you feel comfortable  °o It is  VERY important for male patients to use a reliable birth control method; fertility often increases after surgery  °o All hormonal birth control will be ineffective for 30 days after surgery due to medications given during surgery a barrier method must be used. °o Do not get pregnant for at least 18 months °• Start exercising as soon as your doctor tells you that you can °o Make sure your doctor approves any physical activity °• Start with a simple walking program °• Walk 5-15 minutes each day, 7 days per week.  °• Slowly increase until you are walking 30-45 minutes per day °Consider joining our BELT program. [phone] or email [email or web address] °  °Special Instructions Things to remember: °• Use your CPAP when sleeping if this applies to you ° °• [facility] has two free Bariatric Surgery Support Groups that meet monthly °o The 3rd Thursday of each month, 6 pm, [facility]  °o The 2nd Friday of each month, 11:45 am in the private dining room in the basement of [facility] °• It is very important to keep all follow up appointments with your surgeon, dietitian, primary care physician, and behavioral health practitioner °• Routine follow up schedule with your surgeon include appointments at 2-3 weeks, 6-8 weeks, 6 months, and 1 year at a minimum.  Your surgeon may request to see you more often.   °o After the first year, please follow up with your bariatric surgeon and dietitian at least once a year in order to maintain best weight loss results °[facility]: [phone] °[facility]: [phone] °Bariatric Nurse Coordinator: [phone] °  °   Reviewed and Endorsed  °by [facility] Patient Education Committee, June, 2016 °Edits Approved: Aug, 2018 ° ° ° °

## 2017-09-15 NOTE — Transfer of Care (Signed)
Immediate Anesthesia Transfer of Care Note  Patient: Javier Joyce  Procedure(s) Performed: LAPAROSCOPIC GASTRIC SLEEVE RESECTION WITH UPPER ENDO AND ERAS PATHWAY (N/A Abdomen)  Patient Location: PACU  Anesthesia Type:General  Level of Consciousness: awake, alert  and oriented  Airway & Oxygen Therapy: Patient Spontanous Breathing and Patient connected to face mask oxygen  Post-op Assessment: Report given to RN and Post -op Vital signs reviewed and stable  Post vital signs: Reviewed and stable  Last Vitals:  Vitals Value Taken Time  BP    Temp    Pulse 93 09/15/2017 11:49 AM  Resp 7 09/15/2017 11:49 AM  SpO2 100 % 09/15/2017 11:49 AM  Vitals shown include unvalidated device data.  Last Pain:  Vitals:   09/15/17 0741  TempSrc:   PainSc: 0-No pain      Patients Stated Pain Goal: 4 (56/25/63 8937)  Complications: No apparent anesthesia complications

## 2017-09-15 NOTE — Interval H&P Note (Signed)
History and Physical Interval Note:  09/15/2017 9:04 AM  Javier Joyce  has presented today for surgery, with the diagnosis of MORBID OBESITY  The various methods of treatment have been discussed with the patient and family. After consideration of risks, benefits and other options for treatment, the patient has consented to  Procedure(s): LAPAROSCOPIC GASTRIC SLEEVE RESECTION WITH UPPER ENDO AND ERAS PATHWAY (N/A) as a surgical intervention .  The patient's history has been reviewed, patient examined, no change in status, stable for surgery.  I have reviewed the patient's chart and labs.  Questions were answered to the patient's satisfaction.     [name]

## 2017-09-15 NOTE — Anesthesia Procedure Notes (Signed)
Procedure Name: Intubation Date/Time: 09/15/2017 9:46 AM Performed by: Glory Buff, CRNA Pre-anesthesia Checklist: Patient identified, Emergency Drugs available, Suction available and Patient being monitored Patient Re-evaluated:Patient Re-evaluated prior to induction Oxygen Delivery Method: Circle system utilized Preoxygenation: Pre-oxygenation with 100% oxygen Induction Type: IV induction, Rapid sequence and Cricoid Pressure applied Ventilation: Two handed mask ventilation required Laryngoscope Size: Miller and 2 Grade View: Grade I Tube type: Oral Tube size: 7.5 mm Number of attempts: 2 (SRNA DL x1 no view CRNA Dl x1 successful) Airway Equipment and Method: Stylet Placement Confirmation: ETT inserted through vocal cords under direct vision,  positive ETCO2,  CO2 detector and breath sounds checked- equal and bilateral Secured at: 23 cm Tube secured with: Tape Dental Injury: Teeth and Oropharynx as per pre-operative assessment

## 2017-09-16 ENCOUNTER — Encounter (HOSPITAL_COMMUNITY): Payer: Self-pay | Admitting: Surgery

## 2017-09-16 DIAGNOSIS — Z9884 Bariatric surgery status: Secondary | ICD-10-CM

## 2017-09-16 LAB — CBC WITH DIFFERENTIAL/PLATELET
Basophils Absolute: 0 10*3/uL (ref 0.0–0.1)
Basophils Relative: 0 %
Eosinophils Absolute: 0 10*3/uL (ref 0.0–0.7)
Eosinophils Relative: 0 %
HEMATOCRIT: 40.2 % (ref 39.0–52.0)
HEMOGLOBIN: 13.9 g/dL (ref 13.0–17.0)
LYMPHS ABS: 1.1 10*3/uL (ref 0.7–4.0)
LYMPHS PCT: 11 %
MCH: 29.7 pg (ref 26.0–34.0)
MCHC: 34.6 g/dL (ref 30.0–36.0)
MCV: 85.9 fL (ref 78.0–100.0)
MONO ABS: 0.8 10*3/uL (ref 0.1–1.0)
MONOS PCT: 8 %
NEUTROS ABS: 8.1 10*3/uL — AB (ref 1.7–7.7)
NEUTROS PCT: 81 %
Platelets: 207 10*3/uL (ref 150–400)
RBC: 4.68 MIL/uL (ref 4.22–5.81)
RDW: 13.6 % (ref 11.5–15.5)
WBC: 9.9 10*3/uL (ref 4.0–10.5)

## 2017-09-16 NOTE — Discharge Summary (Signed)
Physician Discharge Summary  Patient ID: Javier Joyce MRN: [id number] DOB/AGE: 48-Dec-1972 48 y.o.  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  Staples in upper incision were to have been removed prior to discharge  Consults: none  Significant Diagnostic Studies: none    Discharge Exam: Blood pressure (!) 148/95, pulse 82, temperature 98.9 F (37.2 C), temperature source Oral, resp. rate 19, height 6' (1.829 m), weight (!) 145.8 kg (321 lb 6 oz), SpO2 100 %. incisons OK  Disposition: Discharge disposition: 01-Home or Self Care       Discharge Instructions    Ambulate hourly while awake   Complete by:  As directed    Call MD for:  difficulty breathing, headache or visual disturbances   Complete by:  As directed    Call MD for:  persistant dizziness or light-headedness   Complete by:  As directed    Call MD for:  persistant nausea and vomiting   Complete by:  As directed    Call MD for:  redness, tenderness, or signs of infection (pain, swelling, redness, odor or green/yellow discharge around incision site)   Complete by:  As directed    Call MD for:  severe uncontrolled pain   Complete by:  As directed    Call MD for:  temperature >101 F   Complete by:  As directed    Diet bariatric full liquid   Complete by:  As directed    Incentive spirometry   Complete by:  As directed    Perform hourly while awake     Allergies as of 09/16/2017      Reactions   Lisinopril Cough   Losartan    Pt has lots of swelling if he does not have diuretic with this medication       Medication List    TAKE these medications   Avanafil 200  MG Tabs Commonly known as:  STENDRA Take 1 tablet by mouth daily as needed (sexual activity). What changed:  how much to take   ketotifen 0.025 % ophthalmic solution Commonly known as:  ZADITOR Place 1 drop into both eyes every 4 (four) hours as needed (itchy eyes).   tadalafil 5 MG tablet Commonly known as:  CIALIS Take 5 mg by mouth daily as needed for erectile dysfunction.   valsartan 80 MG tablet Commonly known as:  DIOVAN Take 1 tablet (80 mg total) by mouth daily. Notes to patient:  Monitor Blood Pressure Daily and keep a log for primary care physician.  You may need to make changes to your medications with rapid weight loss.        Follow-up Information    Sur[facility], [place]. Go on 10/01/2017.   Specialty:  General Surgery Why:  at 915 with Dr Javier Joyce Contact information: [place] [address] [phone]        Surgery, [place] .   Specialty:  General Surgery Contact information: [address] [place] [address] [phone]           Signed: Pedro Joyce 09/16/2017, 11:40 AM

## 2017-09-16 NOTE — Plan of Care (Signed)

## 2017-09-16 NOTE — Progress Notes (Addendum)
Patient alert and oriented, pain is controlled. Patient is tolerating fluids, advanced to protein shake today, patient is tolerating well.  Reviewed Gastric sleeve discharge instructions with patient and patient is able to articulate understanding.  Provided information on BELT program, Support Group and [facility] outpatient pharmacy. All questions answered, will continue to monitor.  2 staples removed from top incision, steri strips applied per Dr Hassell Done  Total 24 hour fluid recall 690 Per dehydration protocol call back one week post op

## 2017-09-16 NOTE — Progress Notes (Signed)
Patient alert and oriented, Post op day 1.  Provided support and encouragement.  Encouraged pulmonary toilet, ambulation and small sips of liquids.  Completed 12 ounces of bari clear liquids started protein shake.  All questions answered.  Will continue to monitor.

## 2017-09-21 ENCOUNTER — Telehealth (HOSPITAL_COMMUNITY): Payer: Self-pay

## 2017-09-21 NOTE — Telephone Encounter (Addendum)
Patient called to discuss post bariatric surgery follow up questions.  See below:   1.  Tell me about your pain and pain management?no pain to report  2.  Let's talk about fluid intake.  How much total fluid are you taking in?65 ounces of fluid no trouble getting it in  3.  How much protein have you taken in the last 2 days?80  Grams of protein daily or more  4.  Have you had nausea?  Tell me about when have experienced nausea and what you did to help?no nausea  5.  Has the frequency or color changed with your urine?urinating frequently light in color  6.  Tell me what your incisions look like?looks good  7.  Have you been passing gas? BM?passing gas and had several bms  8.  If a problem or question were to arise who would you call?  Do you know contact numbers for [facility], [facility], and [facility]?aware of how to contact all services  9.  How has the walking going?walking regularly  10.  How are your vitamins and calcium going?  How are you taking them?mvi twice per day and calcium three times per day  Patient wanted to discuss "head hunger"  He has been feeling.  Not phsycially hungry but never realized how many advertisement for food.  We discussed finding activities to help divert throughts.  I also encouraged patient to come to support group and look toward mental health provider to sort through feelings and emotions related to food.

## 2017-09-29 ENCOUNTER — Encounter: Payer: 59 | Attending: Surgery | Admitting: Registered"

## 2017-09-29 DIAGNOSIS — Z713 Dietary counseling and surveillance: Secondary | ICD-10-CM | POA: Insufficient documentation

## 2017-09-29 DIAGNOSIS — E669 Obesity, unspecified: Secondary | ICD-10-CM

## 2017-09-30 NOTE — Progress Notes (Signed)
Bariatric Class:  Appt start time: 1530 end time:  1630.  2 Week Post-Operative Nutrition Class  Patient was seen on 09/29/2017 for Post[facility]ative [facility]   Surgery date: 09/15/2017 Surgery type: Sleeve Start weight at [facility]: 324.8 Weight today: 299.8 Weight change: 25 lbs loss  Pt states he is consuming at least 64 ounces of fluid and 80 grams of protein a day. Pt states he has experienced more "head hunger" than anything.   TANITA  BODY COMP RESULTS  09/29/2017   BMI (kg/m^2) 41.2   Fat Mass (lbs) 135.0   Fat Free Mass (lbs) 164.8   Total Body Water (lbs) 130.8   The following the learning objectives were met by the patient during this course:  Identifies Phase 3A (Soft, High Proteins) Dietary Goals and will begin from 2 weeks post-operatively to 2 months post-operatively  Identifies appropriate sources of fluids and proteins   States protein recommendations and appropriate sources post-operatively  Identifies the need for appropriate texture modifications, mastication, and bite sizes when consuming solids  Identifies appropriate multivitamin and calcium sources post-operatively  Describes the need for physical activity post-operatively and will follow MD recommendations  States when to call healthcare provider regarding medication questions or post-operative complications  Handouts given during class include:  Phase 3A: Soft, High Protein Diet Handout  Follow-Up Plan: Patient will follow-up at [facility] in 6 weeks for 2 month post-op nutrition visit for diet advancement per MD.

## 2017-10-01 ENCOUNTER — Encounter: Payer: Self-pay | Admitting: Family Medicine

## 2017-10-02 ENCOUNTER — Telehealth: Payer: Self-pay | Admitting: Registered"

## 2017-10-02 NOTE — Telephone Encounter (Signed)
RD called pt to verify fluid intake once starting soft, solid proteins 2 week post-bariatric surgery.   Daily Fluid intake: 64+ ounces Daily Protein intake: 80 grams  Concerns/issues: Pt states he had his first regurgitation episode yesterday with shrimp; talked with surgeon.

## 2017-10-30 ENCOUNTER — Encounter: Payer: Self-pay | Admitting: Family Medicine

## 2017-10-30 ENCOUNTER — Ambulatory Visit (INDEPENDENT_AMBULATORY_CARE_PROVIDER_SITE_OTHER): Payer: 59 | Admitting: Family Medicine

## 2017-10-30 VITALS — BP 128/80 | HR 68 | Temp 97.9°F | Ht 71.5 in | Wt 278.5 lb

## 2017-10-30 DIAGNOSIS — J40 Bronchitis, not specified as acute or chronic: Secondary | ICD-10-CM

## 2017-10-30 DIAGNOSIS — E010 Iodine-deficiency related diffuse (endemic) goiter: Secondary | ICD-10-CM | POA: Insufficient documentation

## 2017-10-30 DIAGNOSIS — J029 Acute pharyngitis, unspecified: Secondary | ICD-10-CM | POA: Diagnosis not present

## 2017-10-30 LAB — POCT RAPID STREP A (OFFICE): Rapid Strep A Screen: NEGATIVE

## 2017-10-30 MED ORDER — AZITHROMYCIN 250 MG PO TABS: ORAL_TABLET | ORAL | 0 refills | Status: DC

## 2017-10-30 NOTE — Patient Instructions (Signed)
Pharyngitis Pharyngitis is redness, pain, and swelling (inflammation) of the throat (pharynx). It is a very common cause of sore throat. Pharyngitis can be caused by a bacteria, but it is usually caused by a virus. Most cases of pharyngitis get better on their own without treatment. What are the causes? This condition may be caused by:  Infection by viruses (viral). Viral pharyngitis spreads from person to person (is contagious) through coughing, sneezing, and sharing of personal items or utensils such as cups, forks, spoons, and toothbrushes.  Infection by bacteria (bacterial). Bacterial pharyngitis may be spread by touching the nose or face after coming in contact with the bacteria, or through more intimate contact, such as kissing.  Allergies. Allergies can cause buildup of mucus in the throat (post-nasal drip), leading to inflammation and irritation. Allergies can also cause blocked nasal passages, forcing breathing through the mouth, which dries and irritates the throat.  What increases the risk? You are more likely to develop this condition if:  You are 5-24 years old.  You are exposed to crowded environments such as daycare, school, or dormitory living.  You live in a cold climate.  You have a weakened disease-fighting (immune) system.  What are the signs or symptoms? Symptoms of this condition vary by the cause (viral, bacterial, or allergies) and can include:  Sore throat.  Fatigue.  Low-grade fever.  Headache.  Joint pain and muscle aches.  Skin rashes.  Swollen glands in the throat (lymph nodes).  Plaque-like film on the throat or tonsils. This is often a symptom of bacterial pharyngitis.  Vomiting.  Stuffy nose (nasal congestion).  Cough.  Red, itchy eyes (conjunctivitis).  Loss of appetite.  How is this diagnosed? This condition is often diagnosed based on your medical history and a physical exam. Your health care provider will ask you questions about  your illness and your symptoms. A swab of your throat may be done to check for bacteria (rapid strep test). Other lab tests may also be done, depending on the suspected cause, but these are rare. How is this treated? This condition usually gets better in 3-4 days without medicine. Bacterial pharyngitis may be treated with antibiotic medicines. Follow these instructions at home:  Take over-the-counter and prescription medicines only as told by your health care provider. ? If you were prescribed an antibiotic medicine, take it as told by your health care provider. Do not stop taking the antibiotic even if you start to feel better. ? Do not give children aspirin because of the association with Reye syndrome.  Drink enough water and fluids to keep your urine clear or pale yellow.  Get a lot of rest.  Gargle with a salt-water mixture 3-4 times a day or as needed. To make a salt-water mixture, completely dissolve -1 tsp of salt in 1 cup of warm water.  If your health care provider approves, you may use throat lozenges or sprays to soothe your throat. Contact a health care provider if:  You have large, tender lumps in your neck.  You have a rash.  You cough up green, yellow-brown, or bloody spit. Get help right away if:  Your neck becomes stiff.  You drool or are unable to swallow liquids.  You cannot drink or take medicines without vomiting.  You have severe pain that does not go away, even after you take medicine.  You have trouble breathing, and it is not caused by a stuffy nose.  You have new pain and swelling in your joints   such as the knees, ankles, wrists, or elbows. Summary  Pharyngitis is redness, pain, and swelling (inflammation) of the throat (pharynx).  While pharyngitis can be caused by a bacteria, the most common causes are viral.  Most cases of pharyngitis get better on their own without treatment.  Bacterial pharyngitis is treated with antibiotic medicines. This  information is not intended to replace advice given to you by your health care provider. Make sure you discuss any questions you have with your health care provider. Document Released: 04/07/2005 Document Revised: 05/13/2016 Document Reviewed: 05/13/2016 Elsevier Interactive Patient Education  2018 Elsevier Inc.  Upper Respiratory Infection, Adult Most upper respiratory infections (URIs) are a viral infection of the air passages leading to the lungs. A URI affects the nose, throat, and upper air passages. The most common type of URI is nasopharyngitis and is typically referred to as "the common cold." URIs run their course and usually go away on their own. Most of the time, a URI does not require medical attention, but sometimes a bacterial infection in the upper airways can follow a viral infection. This is called a secondary infection. Sinus and middle ear infections are common types of secondary upper respiratory infections. Bacterial pneumonia can also complicate a URI. A URI can worsen asthma and chronic obstructive pulmonary disease (COPD). Sometimes, these complications can require emergency medical care and may be life threatening. What are the causes? Almost all URIs are caused by viruses. A virus is a type of germ and can spread from one person to another. What increases the risk? You may be at risk for a URI if:  You smoke.  You have chronic heart or lung disease.  You have a weakened defense (immune) system.  You are very young or very old.  You have nasal allergies or asthma.  You work in crowded or poorly ventilated areas.  You work in health care facilities or schools.  What are the signs or symptoms? Symptoms typically develop 2-3 days after you come in contact with a cold virus. Most viral URIs last 7-10 days. However, viral URIs from the influenza virus (flu virus) can last 14-18 days and are typically more severe. Symptoms may include:  Runny or stuffy (congested)  nose.  Sneezing.  Cough.  Sore throat.  Headache.  Fatigue.  Fever.  Loss of appetite.  Pain in your forehead, behind your eyes, and over your cheekbones (sinus pain).  Muscle aches.  How is this diagnosed? Your health care provider may diagnose a URI by:  Physical exam.  Tests to check that your symptoms are not due to another condition such as: ? Strep throat. ? Sinusitis. ? Pneumonia. ? Asthma.  How is this treated? A URI goes away on its own with time. It cannot be cured with medicines, but medicines may be prescribed or recommended to relieve symptoms. Medicines may help:  Reduce your fever.  Reduce your cough.  Relieve nasal congestion.  Follow these instructions at home:  Take medicines only as directed by your health care provider.  Gargle warm saltwater or take cough drops to comfort your throat as directed by your health care provider.  Use a warm mist humidifier or inhale steam from a shower to increase air moisture. This may make it easier to breathe.  Drink enough fluid to keep your urine clear or pale yellow.  Eat soups and other clear broths and maintain good nutrition.  Rest as needed.  Return to work when your temperature has returned to   normal or as your health care provider advises. You may need to stay home longer to avoid infecting others. You can also use a face mask and careful hand washing to prevent spread of the virus.  Increase the usage of your inhaler if you have asthma.  Do not use any tobacco products, including cigarettes, chewing tobacco, or electronic cigarettes. If you need help quitting, ask your health care provider. How is this prevented? The best way to protect yourself from getting a cold is to practice good hygiene.  Avoid oral or hand contact with people with cold symptoms.  Wash your hands often if contact occurs.  There is no clear evidence that vitamin C, vitamin E, echinacea, or exercise reduces the chance  of developing a cold. However, it is always recommended to get plenty of rest, exercise, and practice good nutrition. Contact a health care provider if:  You are getting worse rather than better.  Your symptoms are not controlled by medicine.  You have chills.  You have worsening shortness of breath.  You have brown or red mucus.  You have yellow or brown nasal discharge.  You have pain in your face, especially when you bend forward.  You have a fever.  You have swollen neck glands.  You have pain while swallowing.  You have white areas in the back of your throat. Get help right away if:  You have severe or persistent: ? Headache. ? Ear pain. ? Sinus pain. ? Chest pain.  You have chronic lung disease and any of the following: ? Wheezing. ? Prolonged cough. ? Coughing up blood. ? A change in your usual mucus.  You have a stiff neck.  You have changes in your: ? Vision. ? Hearing. ? Thinking. ? Mood. This information is not intended to replace advice given to you by your health care provider. Make sure you discuss any questions you have with your health care provider. Document Released: 10/01/2000 Document Revised: 12/09/2015 Document Reviewed: 07/13/2013 Elsevier Interactive Patient Education  2018 Elsevier Inc.  

## 2017-10-30 NOTE — Progress Notes (Signed)
Subjective:  Patient ID: Javier Joyce, male    DOB: 1970-06-07  Age: 47 y.o. MRN: [id number]  CC: Sore Throat   HPI HARDIN HARDENBROOK presents for evaluation and treatment of a 5 to 6-day history of sore throat accompanied by a cough that is sporadically productive of purulent phlegm.  Patient denies fevers chills, postnasal drip, facial pressure teeth pain, reactive airway disease or wheezing.  He did quit smoking earlier this year.  He is status post  Recent application of gastric sleeve.  TSH measured in June of this past year was 1.8.  Outpatient Medications Prior to Visit  Medication Sig Dispense Refill  . Avanafil (STENDRA) 200 MG TABS Take 1 tablet by mouth daily as needed (sexual activity). (Patient taking differently: Take 200 tablets by mouth daily as needed (sexual activity). ) 5 tablet 3  . ketotifen (ZADITOR) 0.025 % ophthalmic solution Place 1 drop into both eyes every 4 (four) hours as needed (itchy eyes).    . tadalafil (CIALIS) 5 MG tablet Take 5 mg by mouth daily as needed for erectile dysfunction.    . valsartan (DIOVAN) 80 MG tablet Take 1 tablet (80 mg total) by mouth daily. 90 tablet 1   No facility-administered medications prior to visit.     ROS Review of Systems  Constitutional: Negative for chills, fatigue, fever and unexpected weight change.  HENT: Positive for sore throat. Negative for congestion, drooling, postnasal drip, rhinorrhea, sinus pressure, sinus pain, trouble swallowing and voice change.   Eyes: Negative.   Respiratory: Positive for cough. Negative for chest tightness, shortness of breath and wheezing.   Cardiovascular: Negative.   Gastrointestinal: Negative.   Endocrine: Negative for cold intolerance and heat intolerance.  Musculoskeletal: Negative for arthralgias and myalgias.  Skin: Negative for pallor and rash.  Allergic/Immunologic: Negative for immunocompromised state.  Neurological: Negative for weakness and headaches.  Hematological:  Negative for adenopathy.  Psychiatric/Behavioral: Negative.     Objective:  BP 128/80   Pulse 68   Temp 97.9 F (36.6 C)   Ht 5' 11.5" (1.816 m)   Wt 278 lb 8 oz (126.3 kg)   SpO2 99%   BMI 38.30 kg/m   BP Readings from Last 3 Encounters:  10/30/17 128/80  09/16/17 (!) 148/95  08/18/17 122/90    Wt Readings from Last 3 Encounters:  10/30/17 278 lb 8 oz (126.3 kg)  09/30/17 299 lb 12.8 oz (136 kg)  09/15/17 (!) 321 lb 6 oz (145.8 kg)    Physical Exam  Constitutional: He is oriented to person, place, and time. He appears well-developed and well-nourished.  Non-toxic appearance. He does not appear ill. No distress.  HENT:  Head: Normocephalic and atraumatic.  Right Ear: Hearing, tympanic membrane and ear canal normal. No drainage.  Left Ear: Hearing, tympanic membrane and ear canal normal. No drainage.  Mouth/Throat: Uvula is midline and mucous membranes are normal. Posterior oropharyngeal edema and posterior oropharyngeal erythema present. No tonsillar abscesses.  Eyes: Pupils are equal, round, and reactive to light. EOM are normal.  Neck: Normal range of motion. Neck supple. Thyromegaly (? enlargement of right lobe of thyroid. nontender per patient report) present.  Cardiovascular: Normal rate, regular rhythm and normal heart sounds.  Pulmonary/Chest: Effort normal and breath sounds normal.  Lymphadenopathy:    He has no cervical adenopathy.  Neurological: He is alert and oriented to person, place, and time.  Skin: Skin is warm and dry.  Psychiatric: He has a normal mood and affect. His  behavior is normal.    Lab Results  Component Value Date   WBC 9.9 09/16/2017   HGB 13.9 09/16/2017   HCT 40.2 09/16/2017   PLT 207 09/16/2017   GLUCOSE 121 (H) 09/15/2017   CHOL 186 09/22/2016   TRIG 177 (A) 09/22/2016   LDLCALC 104 09/22/2016   ALT 40 09/15/2017   AST 42 (H) 09/15/2017   NA 138 09/15/2017   K 4.2 09/15/2017   CL 103 09/15/2017   CREATININE 1.21 09/15/2017     BUN 12 09/15/2017   CO2 24 09/15/2017   TSH 1.80 09/22/2016    No results found.  Assessment & Plan:   [name] was seen today for sore throat.  Diagnoses and all orders for this visit:  Sore throat -     POC Rapid Strep A -     azithromycin (ZITHROMAX) 250 MG tablet; Take 2 today and then one each day until finished.  Pharyngitis, unspecified etiology -     azithromycin (ZITHROMAX) 250 MG tablet; Take 2 today and then one each day until finished.  Bronchitis -     azithromycin (ZITHROMAX) 250 MG tablet; Take 2 today and then one each day until finished.  Thyromegaly -     US THYROID; Future   I am having Saralyn Pilar C. Trovato start on azithromycin. I am also having him maintain his Avanafil, valsartan, ketotifen, and tadalafil.  Meds ordered this encounter  Medications  . azithromycin (ZITHROMAX) 250 MG tablet    Sig: Take 2 today and then one each day until finished.    Dispense:  6 tablet    Refill:  0     Follow-up: No follow-ups on file.  Libby Maw, MD

## 2017-11-07 ENCOUNTER — Ambulatory Visit (HOSPITAL_BASED_OUTPATIENT_CLINIC_OR_DEPARTMENT_OTHER): Payer: 59

## 2017-11-13 ENCOUNTER — Ambulatory Visit: Payer: 59 | Admitting: Registered"

## 2017-11-16 ENCOUNTER — Ambulatory Visit: Payer: 59 | Admitting: Registered"

## 2017-12-04 ENCOUNTER — Other Ambulatory Visit: Payer: Self-pay | Admitting: *Deleted

## 2017-12-04 ENCOUNTER — Telehealth: Payer: Self-pay | Admitting: Family Medicine

## 2017-12-04 MED ORDER — AVANAFIL 200 MG PO TABS: 1.0000 | ORAL_TABLET | Freq: Every day | ORAL | 3 refills | Status: DC | PRN

## 2017-12-04 NOTE — Telephone Encounter (Signed)
Copied from [place] [phone]. Topic: Quick Communication - Rx Refill/Question >> Dec 04, 2017  9:42 AM Selinda Flavin B, NT wrote: Medication: Avanafil (STENDRA) 200 MG TABS  Has the patient contacted their pharmacy? Yes.   (Agent: If no, request that the patient contact the pharmacy for the refill.) (Agent: If yes, when and what did the pharmacy advise?)  Preferred Pharmacy (with phone number or street name): [facility] ([place]), [place] - [place]  Agent: Please be advised that RX refills may take up to 3 business days. We ask that you follow-up with your pharmacy.

## 2017-12-04 NOTE — Telephone Encounter (Signed)
Rx refilled per protocol: LOV: 07/21/17

## 2018-02-16 ENCOUNTER — Ambulatory Visit: Payer: 59 | Admitting: Registered"

## 2018-02-17 ENCOUNTER — Ambulatory Visit: Payer: 59 | Admitting: Family Medicine

## 2018-02-22 ENCOUNTER — Ambulatory Visit: Payer: 59 | Admitting: Family Medicine

## 2018-03-02 ENCOUNTER — Ambulatory Visit: Payer: 59 | Admitting: Family Medicine

## 2018-03-02 DIAGNOSIS — Z0289 Encounter for other administrative examinations: Secondary | ICD-10-CM

## 2018-03-08 DIAGNOSIS — N529 Male erectile dysfunction, unspecified: Secondary | ICD-10-CM | POA: Diagnosis not present

## 2018-03-08 DIAGNOSIS — Z9889 Other specified postprocedural states: Secondary | ICD-10-CM | POA: Diagnosis not present

## 2018-03-08 DIAGNOSIS — Z131 Encounter for screening for diabetes mellitus: Secondary | ICD-10-CM | POA: Diagnosis not present

## 2018-06-10 ENCOUNTER — Encounter: Payer: Self-pay | Admitting: Family Medicine

## 2018-06-10 ENCOUNTER — Ambulatory Visit: Payer: 59 | Admitting: Family Medicine

## 2018-06-10 VITALS — BP 144/78 | HR 73 | Temp 98.1°F | Ht 71.5 in | Wt 251.0 lb

## 2018-06-10 DIAGNOSIS — M170 Bilateral primary osteoarthritis of knee: Secondary | ICD-10-CM | POA: Insufficient documentation

## 2018-06-10 DIAGNOSIS — R3911 Hesitancy of micturition: Secondary | ICD-10-CM

## 2018-06-10 DIAGNOSIS — N529 Male erectile dysfunction, unspecified: Secondary | ICD-10-CM | POA: Diagnosis not present

## 2018-06-10 LAB — PSA: PSA: 0.96 ng/mL (ref 0.10–4.00)

## 2018-06-10 MED ORDER — METHYLPREDNISOLONE ACETATE 40 MG/ML IJ SUSP
40.0000 mg | Freq: Once | INTRAMUSCULAR | Status: AC
  Administered 2018-06-10: 40 mg via INTRAMUSCULAR

## 2018-06-10 MED ORDER — SILDENAFIL CITRATE 100 MG PO TABS: 100.0000 mg | ORAL_TABLET | Freq: Every day | ORAL | 5 refills | Status: DC | PRN

## 2018-06-10 MED ORDER — METHYLPREDNISOLONE ACETATE 40 MG/ML IJ SUSP
40.0000 mg | Freq: Once | INTRAMUSCULAR | Status: AC
  Administered 2018-06-10: 40 mg via INTRA_ARTICULAR

## 2018-06-10 NOTE — Progress Notes (Signed)
Javier Joyce - 48 y.o. male MRN [id number]  Date of birth: 03/13/1971  Subjective Chief Complaint  Patient presents with  . Knee Pain    HPI [name] is a 48 y.o. male here today for follow up of knee pain.  Since the last time I saw him he has had weight loss surgery.  He has done remarkably well with this and weight is down nearly 75lbs.  Knees have improved some but still has pain.  He has had good success with knee injection in the past and would like to have this again.  Last injection as 07/2017.  He denies increased swelling, locking, popping or instability.    He also has noticed some increased hesitancy with urination.  Would like to have PSA checked.  Currently using stendra for ED symptoms.  Insurance will only cover 3/month and it is expensive.  Would like to try generic sildenafil.    ROS:  A comprehensive ROS was completed and negative except as noted per HPI  Allergies  Allergen Reactions  . Lisinopril Cough  . Losartan     Pt has lots of swelling if he does not have diuretic with this medication     Past Medical History:  Diagnosis Date  . Complication of anesthesia    Pt has woken up during the procedure  . Hypertension   . Sleep apnea     Past Surgical History:  Procedure Laterality Date  . ACHILLES TENDON REPAIR Bilateral 2008  . LAPAROSCOPIC GASTRIC SLEEVE RESECTION N/A 09/15/2017   Procedure: LAPAROSCOPIC GASTRIC SLEEVE RESECTION WITH UPPER ENDO AND ERAS PATHWAY;  Surgeon: Johnathan Hausen, MD;  Location: [facility];  Service: General;  Laterality: N/A;  . PATELLAR TENDON REPAIR Bilateral   . right and left knee repair      Social History   Socioeconomic History  . Marital status: Married    Spouse name: Not on file  . Number of children: Not on file  . Years of education: Not on file  . Highest education level: Not on file  Occupational History  . Not on file  Social Needs  . Financial resource strain: Not on file  . Food insecurity:   Worry: Never true    Inability: Never true  . Transportation needs:    Medical: Not on file    Non-medical: Not on file  Tobacco Use  . Smoking status: Former Smoker    Years: 2.00    Last attempt to quit: 08/24/2017    Years since quitting: 0.7  . Smokeless tobacco: Never Used  . Tobacco comment: Social smoker  Substance and Sexual Activity  . Alcohol use: Yes    Comment: occass  . Drug use: Never  . Sexual activity: Yes  Lifestyle  . Physical activity:    Days per week: Not on file    Minutes per session: Not on file  . Stress: Not on file  Relationships  . Social connections:    Talks on phone: Not on file    Gets together: Not on file    Attends religious service: Not on file    Active member of club or organization: Not on file    Attends meetings of clubs or organizations: Not on file    Relationship status: Not on file  Other Topics Concern  . Not on file  Social History Narrative  . Not on file    No family history on file.  Health Maintenance  Topic Date Due  .  INFLUENZA VACCINE  07/20/2018 (Originally 11/19/2017)  . TETANUS/TDAP  04/21/2026  . HIV Screening  Completed    ----------------------------------------------------------------------------------------------------------------------------------------------------------------------------------------------------------------- Physical Exam BP (!) 144/78   Pulse 73   Temp 98.1 F (36.7 C) (Oral)   Ht 5' 11.5" (1.816 m)   Wt 251 lb (113.9 kg)   SpO2 99%   BMI 34.52 kg/m   Physical Exam Constitutional:      Appearance: Normal appearance.  HENT:     Head: Normocephalic and atraumatic.  Eyes:     General: No scleral icterus. Cardiovascular:     Rate and Rhythm: Normal rate and regular rhythm.     Heart sounds: Normal heart sounds.  Pulmonary:     Effort: Pulmonary effort is normal.     Breath sounds: Normal breath sounds.  Musculoskeletal:     Comments: Bilateral scarring to anterior knee.   Normal ROM with mild ttp. +patellar compression test.   Neurological:     General: No focal deficit present.     Mental Status: He is alert.  Psychiatric:        Mood and Affect: Mood normal.        Behavior: Behavior normal.    Procedure Note:  Procedure explained in detail and potential complications reviewed.  He was given opportunity for any questions.  Consent obtained.  Time out completed and patient was prepped in typical sterile fashion.  Ethyl chloride was applied to the skin and the left knee was then injected with 63mL  40mg /mL methylprednisolone and 48mL of 1% lidocaine using an anterior approach.  He tolerated this well.  Band-aid was applied.  The Right knee was then prepped in typical sterile fashion.   Ethyl chloride was applied to the skin and the right knee was then injected with 7mL  40mg /mL methylprednisolone and 23mL of 1% lidocaine using an anterior approach.  He tolerated this well.  Band-aid was applied. ------------------------------------------------------------------------------------------------------------------------------------------------------------------------------------------------------------------- Assessment and Plan  Bilateral primary osteoarthritis of knee -Bilateral knee injection completed today, tolerated well.  See procedure note.   Erectile dysfunction -Change stendra to sildenafil.  -Check PSA today.

## 2018-06-10 NOTE — Assessment & Plan Note (Signed)
-  Change stendra to sildenafil.  -Check PSA today.

## 2018-06-10 NOTE — Assessment & Plan Note (Signed)
-  Bilateral knee injection completed today, tolerated well.  See procedure note.

## 2018-06-10 NOTE — Patient Instructions (Addendum)
Great to see you! Congratulations on your weight loss.  If you develop more pain, swelling, redness or fever symptoms please let me know.

## 2018-06-14 ENCOUNTER — Ambulatory Visit (INDEPENDENT_AMBULATORY_CARE_PROVIDER_SITE_OTHER): Payer: 59 | Admitting: Family Medicine

## 2018-06-14 ENCOUNTER — Encounter: Payer: Self-pay | Admitting: Family Medicine

## 2018-06-14 ENCOUNTER — Telehealth: Payer: Self-pay | Admitting: Family Medicine

## 2018-06-14 ENCOUNTER — Ambulatory Visit: Payer: Self-pay | Admitting: Family Medicine

## 2018-06-14 VITALS — BP 132/66 | HR 68 | Temp 97.9°F | Ht 71.5 in | Wt 251.0 lb

## 2018-06-14 DIAGNOSIS — I10 Essential (primary) hypertension: Secondary | ICD-10-CM

## 2018-06-14 DIAGNOSIS — Z0000 Encounter for general adult medical examination without abnormal findings: Secondary | ICD-10-CM

## 2018-06-14 DIAGNOSIS — Z13220 Encounter for screening for lipoid disorders: Secondary | ICD-10-CM

## 2018-06-14 LAB — CBC
HCT: 43.8 % (ref 39.0–52.0)
Hemoglobin: 15.1 g/dL (ref 13.0–17.0)
MCHC: 34.6 g/dL (ref 30.0–36.0)
MCV: 86.6 fl (ref 78.0–100.0)
PLATELETS: 201 10*3/uL (ref 150.0–400.0)
RBC: 5.06 Mil/uL (ref 4.22–5.81)
RDW: 13.1 % (ref 11.5–15.5)
WBC: 6.4 10*3/uL (ref 4.0–10.5)

## 2018-06-14 LAB — COMPREHENSIVE METABOLIC PANEL
ALT: 15 U/L (ref 0–53)
AST: 20 U/L (ref 0–37)
Albumin: 4.4 g/dL (ref 3.5–5.2)
Alkaline Phosphatase: 104 U/L (ref 39–117)
BILIRUBIN TOTAL: 1.1 mg/dL (ref 0.2–1.2)
BUN: 15 mg/dL (ref 6–23)
CO2: 31 meq/L (ref 19–32)
Calcium: 9.5 mg/dL (ref 8.4–10.5)
Chloride: 101 mEq/L (ref 96–112)
Creatinine, Ser: 1.15 mg/dL (ref 0.40–1.50)
GFR: 81.86 mL/min (ref >60.00)
GLUCOSE: 84 mg/dL (ref 70–99)
Potassium: 4.4 mEq/L (ref 3.5–5.1)
SODIUM: 138 meq/L (ref 135–145)
Total Protein: 7.9 g/dL (ref 6.0–8.3)

## 2018-06-14 LAB — LIPID PANEL
CHOL/HDL RATIO: 3
Cholesterol: 189 mg/dL (ref 0–200)
HDL: 65.5 mg/dL (ref >39.00)
LDL Cholesterol: 111 mg/dL — ABNORMAL HIGH (ref 0–99)
NONHDL: 123.53
Triglycerides: 63 mg/dL (ref 0.0–149.0)
VLDL: 12.6 mg/dL (ref 0.0–40.0)

## 2018-06-14 LAB — POCT URINALYSIS DIPSTICK
BILIRUBIN UA: NEGATIVE
Glucose, UA: NEGATIVE
KETONES UA: NEGATIVE
Leukocytes, UA: NEGATIVE
Nitrite, UA: NEGATIVE
PH UA: 6 (ref 5.0–8.0)
Protein, UA: POSITIVE — AB
RBC UA: NEGATIVE
SPEC GRAV UA: 1.025 (ref 1.010–1.025)
UROBILINOGEN UA: 1 U/dL

## 2018-06-14 LAB — TSH: TSH: 1.56 u[IU]/mL (ref 0.35–4.50)

## 2018-06-14 MED ORDER — OSELTAMIVIR PHOSPHATE 75 MG PO CAPS: 75.0000 mg | ORAL_CAPSULE | Freq: Every day | ORAL | 0 refills | Status: DC

## 2018-06-14 NOTE — Addendum Note (Signed)
Addended by: Verlene Mayer A on: 06/14/2018 04:09 PM   Modules accepted: Orders

## 2018-06-14 NOTE — Telephone Encounter (Signed)
Pt returned call to office and states that prescription for Tamiflu was not received. See telephone encounter from 06/14/18. Medication resent to pharmacy electronically.

## 2018-06-14 NOTE — Telephone Encounter (Signed)
Pt called in requesting flu preventative medication.  His daughter was diagnosed with the flu this morning and the pediatrician suggested the rest of the family get a preventative.  I attempted to call him however there was no answer and no voicemail set up to leave a message.  I have forwarded his request to Dr. Luetta Nutting for further disposition.

## 2018-06-14 NOTE — Telephone Encounter (Signed)
Please advise 

## 2018-06-14 NOTE — Telephone Encounter (Signed)
Copied from [place] [phone]. Topic: Quick Communication - See Telephone Encounter >> Jun 14, 2018  5:22 PM Rutherford Nail, [place] wrote: CRM for notification. See Telephone encounter for: 06/14/18. Patient calling and states that the pharmacy does not have any medications for him. Advised that the Tamiflu was called in. Patient states that the pharmacy told him that they just cleared the messages and there was nothing on there for him. Would like to know if this could be resent? Please advise. [id number] - [place] ([address]), [place]

## 2018-06-14 NOTE — Telephone Encounter (Signed)
  Reason for Disposition . [1] Influenza EXPOSURE within last 48 hours (2 days) AND [2] NOT HIGH RISK AND [3] strongly requests antiviral medication  Answer Assessment - Initial Assessment Questions 1. TYPE of EXPOSURE: "How were you exposed?" (e.g., close contact, not a close contact)     I was not able to talk with pt.   See documentation. His daughter was diagnosed with the flu this morning.   Pediatrician recommended the rest of the family have a preventative medication. 2. DATE of EXPOSURE: "When did the exposure occur?" (e.g., hour, days, weeks)     Daughter diagnosed this morning. 3. PREGNANCY: "Is there any chance you are pregnant?" "When was your last menstrual period?"     N/A 4. HIGH RISK for COMPLICATIONS: "Do you have any heart or lung problems? Do you have a weakened immune system?" (e.g., CHF, COPD, asthma, HIV positive, chemotherapy, renal failure, diabetes mellitus, sickle cell anemia)     Unknown due to not able to contact pt.   No voicemail set up.   He left a message to be relayed to Dr. Zigmund Daniel. 5. SYMPTOMS: "Do you have any symptoms?" (e.g., cough, fever, sore throat, difficulty breathing).     None mentioned in the message he called in.  Protocols used: INFLUENZA EXPOSURE-A-AH

## 2018-06-14 NOTE — Telephone Encounter (Signed)
Please send in tamiflu 75mg .  Take 1 tab daily x10 days.  Dispense #10 w/ no refills.

## 2018-06-14 NOTE — Patient Instructions (Signed)

## 2018-06-14 NOTE — Telephone Encounter (Signed)
Pt returned call to office and stated that pharmacy had not received Tamiflu prescription. See telephone encounter for 06/14/18. Pharmacy tech states that only one pharmacist was working and was not available to take verbal order over the phone and offered triage nurse to be transfer to voicemail so that prescription could be left via message.  Notified pharmacy tech that prescription would be sent in electronically.

## 2018-06-14 NOTE — Assessment & Plan Note (Signed)
Well adult Orders Placed This Encounter  Procedures  . Comp Met (CMET)  . CBC  . Lipid Profile  . TSH  . POCT Urinalysis Dipstick  Screenings: Lipid panel Immunizations:   UTD Anticipatory guidance/Risk factor reduction:  Recommendations per AVS

## 2018-06-14 NOTE — Telephone Encounter (Signed)
Tamiflu sent in to his pharmacy, patient informed.

## 2018-06-14 NOTE — Progress Notes (Signed)
Javier Joyce - 48 y.o. male MRN [id number]  Date of birth: January 12, 1971  Subjective Chief Complaint  Patient presents with  . Annual Exam    denies health concerns    HPI Javier Joyce is a 48 y.o. male here today for annual exam.  He has a history of HTN and OSA that are much better controlled since having weight loss surgery.  He had bilateral knee injection last week and reports he is doing well from this.  He denies any additional concerns today.   Review of Systems  Constitutional: Negative for chills, fever, malaise/fatigue and weight loss.  HENT: Negative for congestion, ear pain and sore throat.   Eyes: Negative for blurred vision, double vision and pain.  Respiratory: Negative for cough and shortness of breath.   Cardiovascular: Negative for chest pain and palpitations.  Gastrointestinal: Negative for abdominal pain, blood in stool, constipation, heartburn and nausea.  Genitourinary: Negative for dysuria and urgency.  Musculoskeletal: Negative for joint pain and myalgias.  Neurological: Negative for dizziness and headaches.  Endo/Heme/Allergies: Does not bruise/bleed easily.  Psychiatric/Behavioral: Negative for depression. The patient is not nervous/anxious and does not have insomnia.     Allergies  Allergen Reactions  . Lisinopril Cough  . Losartan     Pt has lots of swelling if he does not have diuretic with this medication     Past Medical History:  Diagnosis Date  . Complication of anesthesia    Pt has woken up during the procedure  . Hypertension   . Sleep apnea     Past Surgical History:  Procedure Laterality Date  . ACHILLES TENDON REPAIR Bilateral 2008  . LAPAROSCOPIC GASTRIC SLEEVE RESECTION N/A 09/15/2017   Procedure: LAPAROSCOPIC GASTRIC SLEEVE RESECTION WITH UPPER ENDO AND ERAS PATHWAY;  Surgeon: Johnathan Hausen, MD;  Location: [facility];  Service: General;  Laterality: N/A;  . PATELLAR TENDON REPAIR Bilateral   . right and left knee repair       Social History   Socioeconomic History  . Marital status: Married    Spouse name: Not on file  . Number of children: Not on file  . Years of education: Not on file  . Highest education level: Not on file  Occupational History  . Not on file  Social Needs  . Financial resource strain: Not on file  . Food insecurity:    Worry: Never true    Inability: Never true  . Transportation needs:    Medical: Not on file    Non-medical: Not on file  Tobacco Use  . Smoking status: Former Smoker    Years: 2.00    Last attempt to quit: 08/24/2017    Years since quitting: 0.8  . Smokeless tobacco: Never Used  . Tobacco comment: Social smoker  Substance and Sexual Activity  . Alcohol use: Yes    Comment: occass  . Drug use: Never  . Sexual activity: Yes  Lifestyle  . Physical activity:    Days per week: Not on file    Minutes per session: Not on file  . Stress: Not on file  Relationships  . Social connections:    Talks on phone: Not on file    Gets together: Not on file    Attends religious service: Not on file    Active member of club or organization: Not on file    Attends meetings of clubs or organizations: Not on file    Relationship status: Not on file  Other Topics  Concern  . Not on file  Social History Narrative  . Not on file    No family history on file.  Health Maintenance  Topic Date Due  . INFLUENZA VACCINE  07/20/2018 (Originally 11/19/2017)  . TETANUS/TDAP  04/21/2026  . HIV Screening  Completed    ----------------------------------------------------------------------------------------------------------------------------------------------------------------------------------------------------------------- Physical Exam BP 132/66   Pulse 68   Temp 97.9 F (36.6 C) (Oral)   Ht 5' 11.5" (1.816 m)   Wt 251 lb (113.9 kg)   SpO2 99%   BMI 34.52 kg/m   Physical Exam Constitutional:      General: He is not in acute distress. HENT:     Head: Normocephalic  and atraumatic.     Right Ear: External ear normal.     Left Ear: External ear normal.     Mouth/Throat:     Mouth: Mucous membranes are moist.  Eyes:     General: No scleral icterus. Neck:     Musculoskeletal: Normal range of motion.     Thyroid: No thyromegaly.  Cardiovascular:     Rate and Rhythm: Normal rate and regular rhythm.     Heart sounds: Normal heart sounds.  Pulmonary:     Effort: Pulmonary effort is normal.     Breath sounds: Normal breath sounds.  Abdominal:     General: Bowel sounds are normal. There is no distension.     Palpations: Abdomen is soft.     Tenderness: There is no abdominal tenderness. There is no guarding.  Lymphadenopathy:     Cervical: No cervical adenopathy.  Skin:    General: Skin is warm and dry.     Findings: No rash.  Neurological:     Mental Status: He is alert and oriented to person, place, and time.     Cranial Nerves: No cranial nerve deficit.     Motor: No abnormal muscle tone.  Psychiatric:        Mood and Affect: Mood normal.        Behavior: Behavior normal.     ------------------------------------------------------------------------------------------------------------------------------------------------------------------------------------------------------------------- Assessment and Plan  Well adult exam Well adult Orders Placed This Encounter  Procedures  . Comp Met (CMET)  . CBC  . Lipid Profile  . TSH  . POCT Urinalysis Dipstick  Screenings: Lipid panel Immunizations:   UTD Anticipatory guidance/Risk factor reduction:  Recommendations per AVS

## 2018-06-15 ENCOUNTER — Telehealth: Payer: Self-pay

## 2018-06-15 NOTE — Telephone Encounter (Signed)
Pt informed that the [facility] on [address] didn't have any tamilfu available  Rx was sent to the [facility] on [address].

## 2018-10-10 IMAGING — RF DG UGI W/ KUB
12 series · 12 of 12 positions shown · non-contrast
Comparison: None.

CLINICAL DATA: Morbid obesity. Pre-op evaluation for bariatric
surgery.

EXAM:
UPPER GI SERIES WITH KUB
TECHNIQUE: After obtaining a scout radiograph a routine upper GI series was
performed using thin barium.
FLUOROSCOPY TIME:  Fluoroscopy Time:  2 minutes 42 seconds
Radiation Exposure Index (if provided by the fluoroscopic device):
189.6 mGy
Number of Acquired Spot Images: 0

[Series 1: t abdomen supine · 0.15mm/px · 1 of 1 slices shown]
[im 1/1]
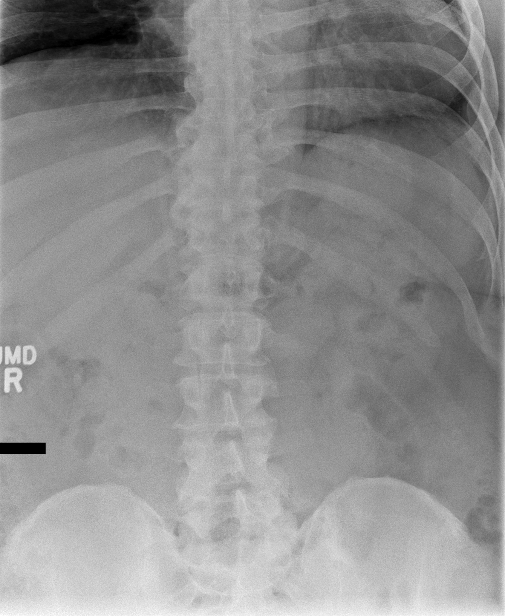

[Series 2: cp_standard · 0.17mm/px · 1 of 1 slices shown (1 of 11)]
[im 1/1]
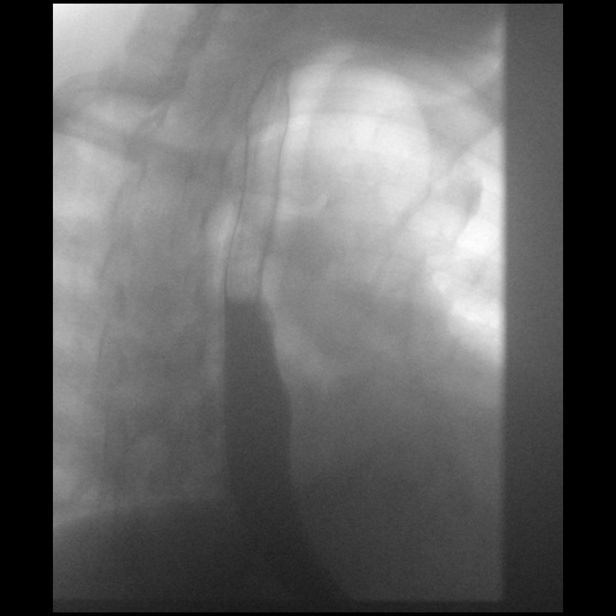

[Series 3: cp_standard · 0.18mm/px · 1 of 1 slices shown (2 of 11)]
[im 1/1]
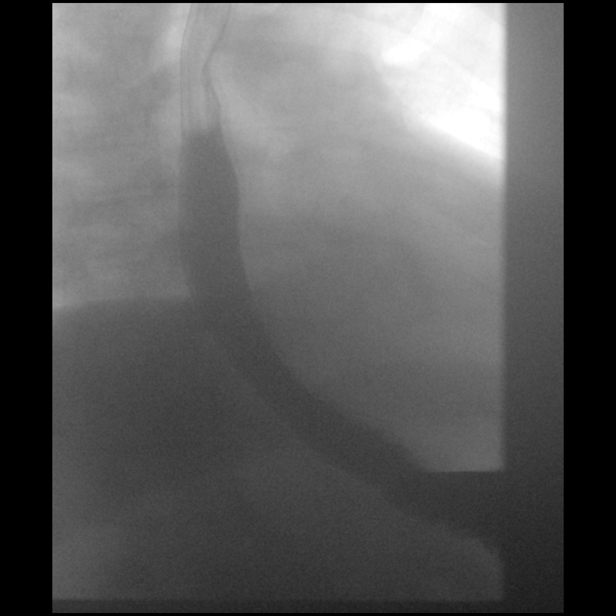

[Series 4: cp_standard · 0.20mm/px · 1 of 1 slices shown (3 of 11)]
[im 1/1]
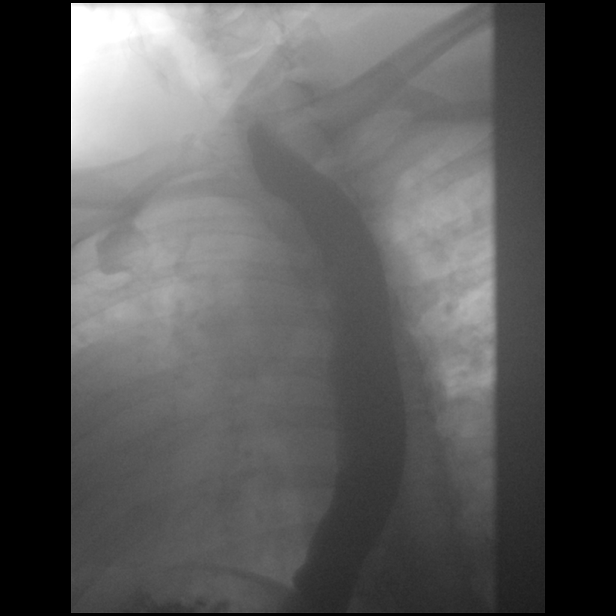

[Series 5: cp_standard · 0.21mm/px · 1 of 1 slices shown (4 of 11)]
[im 1/1]
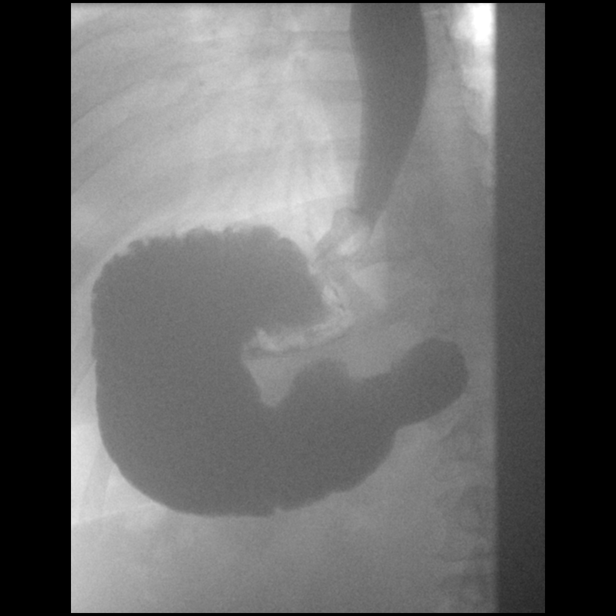

[Series 6: cp_standard · 0.18mm/px · 1 of 1 slices shown (5 of 11)]
[im 1/1]
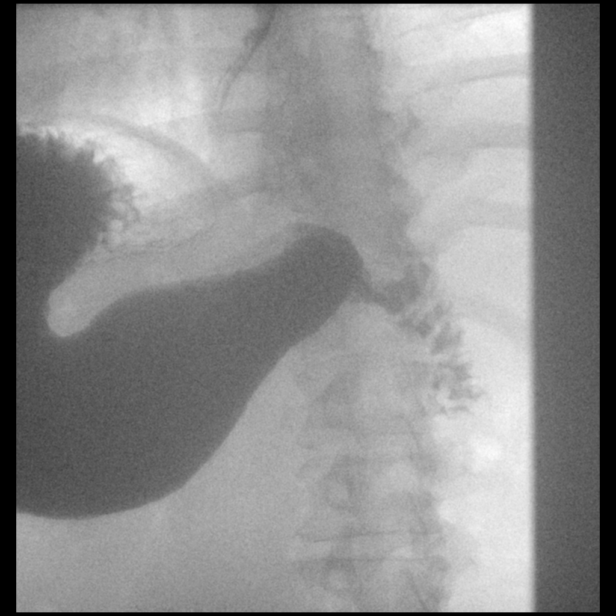

[Series 7: cp_standard · 0.27mm/px · 1 of 1 slices shown (6 of 11)]
[im 1/1]
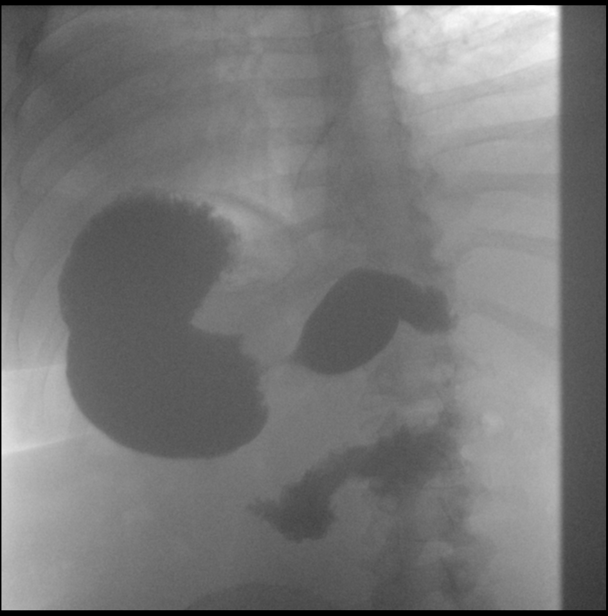

[Series 8: cp_standard · 0.27mm/px · 1 of 1 slices shown (7 of 11)]
[im 1/1]
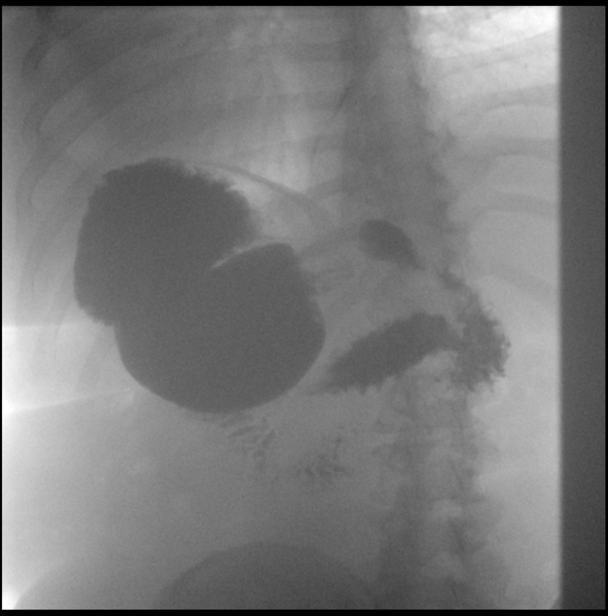

[Series 9: cp_standard · 0.27mm/px · 1 of 1 slices shown (8 of 11)]
[im 1/1]
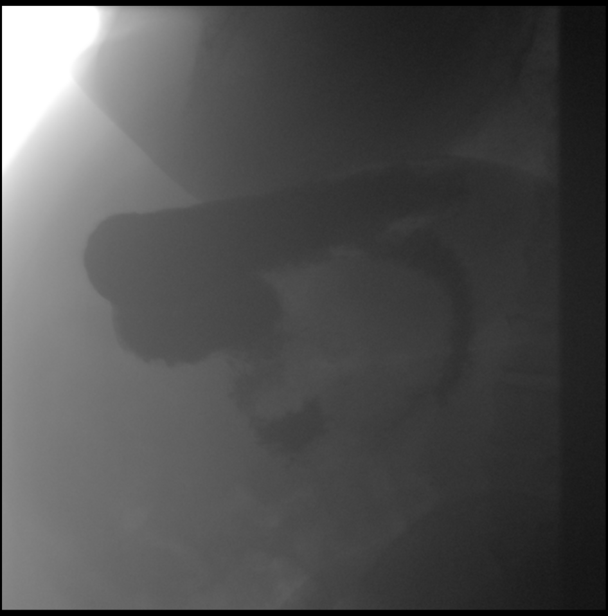

[Series 10: cp_standard · 0.27mm/px · 1 of 1 slices shown (9 of 11)]
[im 1/1]
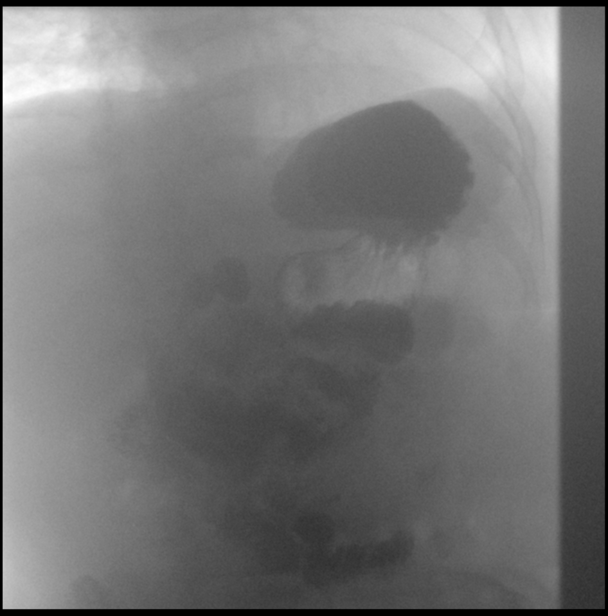

[Series 11: cp_standard · 0.20mm/px · 1 of 1 slices shown (10 of 11)]
[im 1/1]
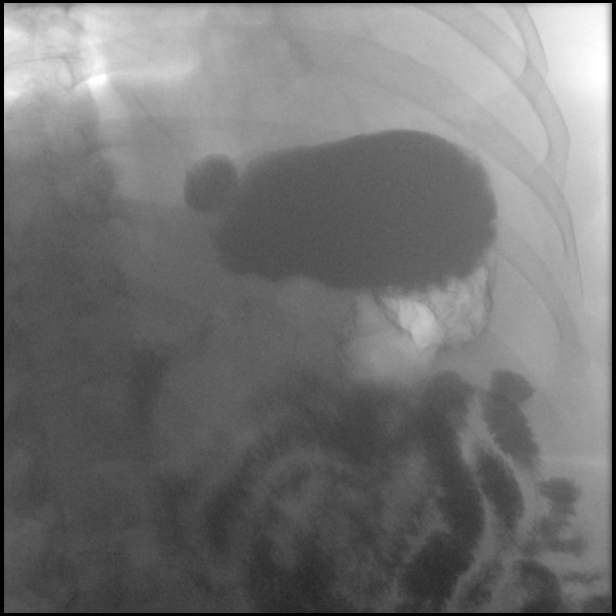

[Series 12: cp_standard · 0.20mm/px · 1 of 1 slices shown (11 of 11)]
[im 1/1]
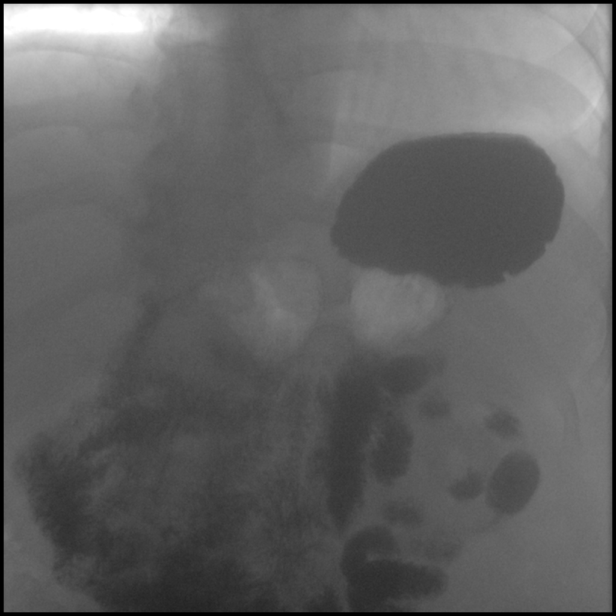

[12 of 12 positions shown; findings below may reference images not displayed]

FINDINGS: Scout radiograph:  Unremarkable bowel gas pattern.

Esophagus: No evidence of esophageal mass or stricture. Motility is
within normal limits. No gastroesophageal reflux observed.

Stomach: No hiatal hernia visualized. No evidence of gastric mass or
ulcer.

Duodenum: No ulcer or other significant abnormality seen involving
duodenal bulb or sweep.

Other:  None.
IMPRESSION: Negative upper GI series.

## 2018-10-10 IMAGING — DX DG CHEST 2V
2 series · 2 of 2 positions shown · non-contrast
Comparison: 01/10/2016.

CLINICAL DATA: Morbid obesity. Pre-op evaluation for bariatric
surgery.

EXAM:
CHEST  2 VIEW

[chest pa]
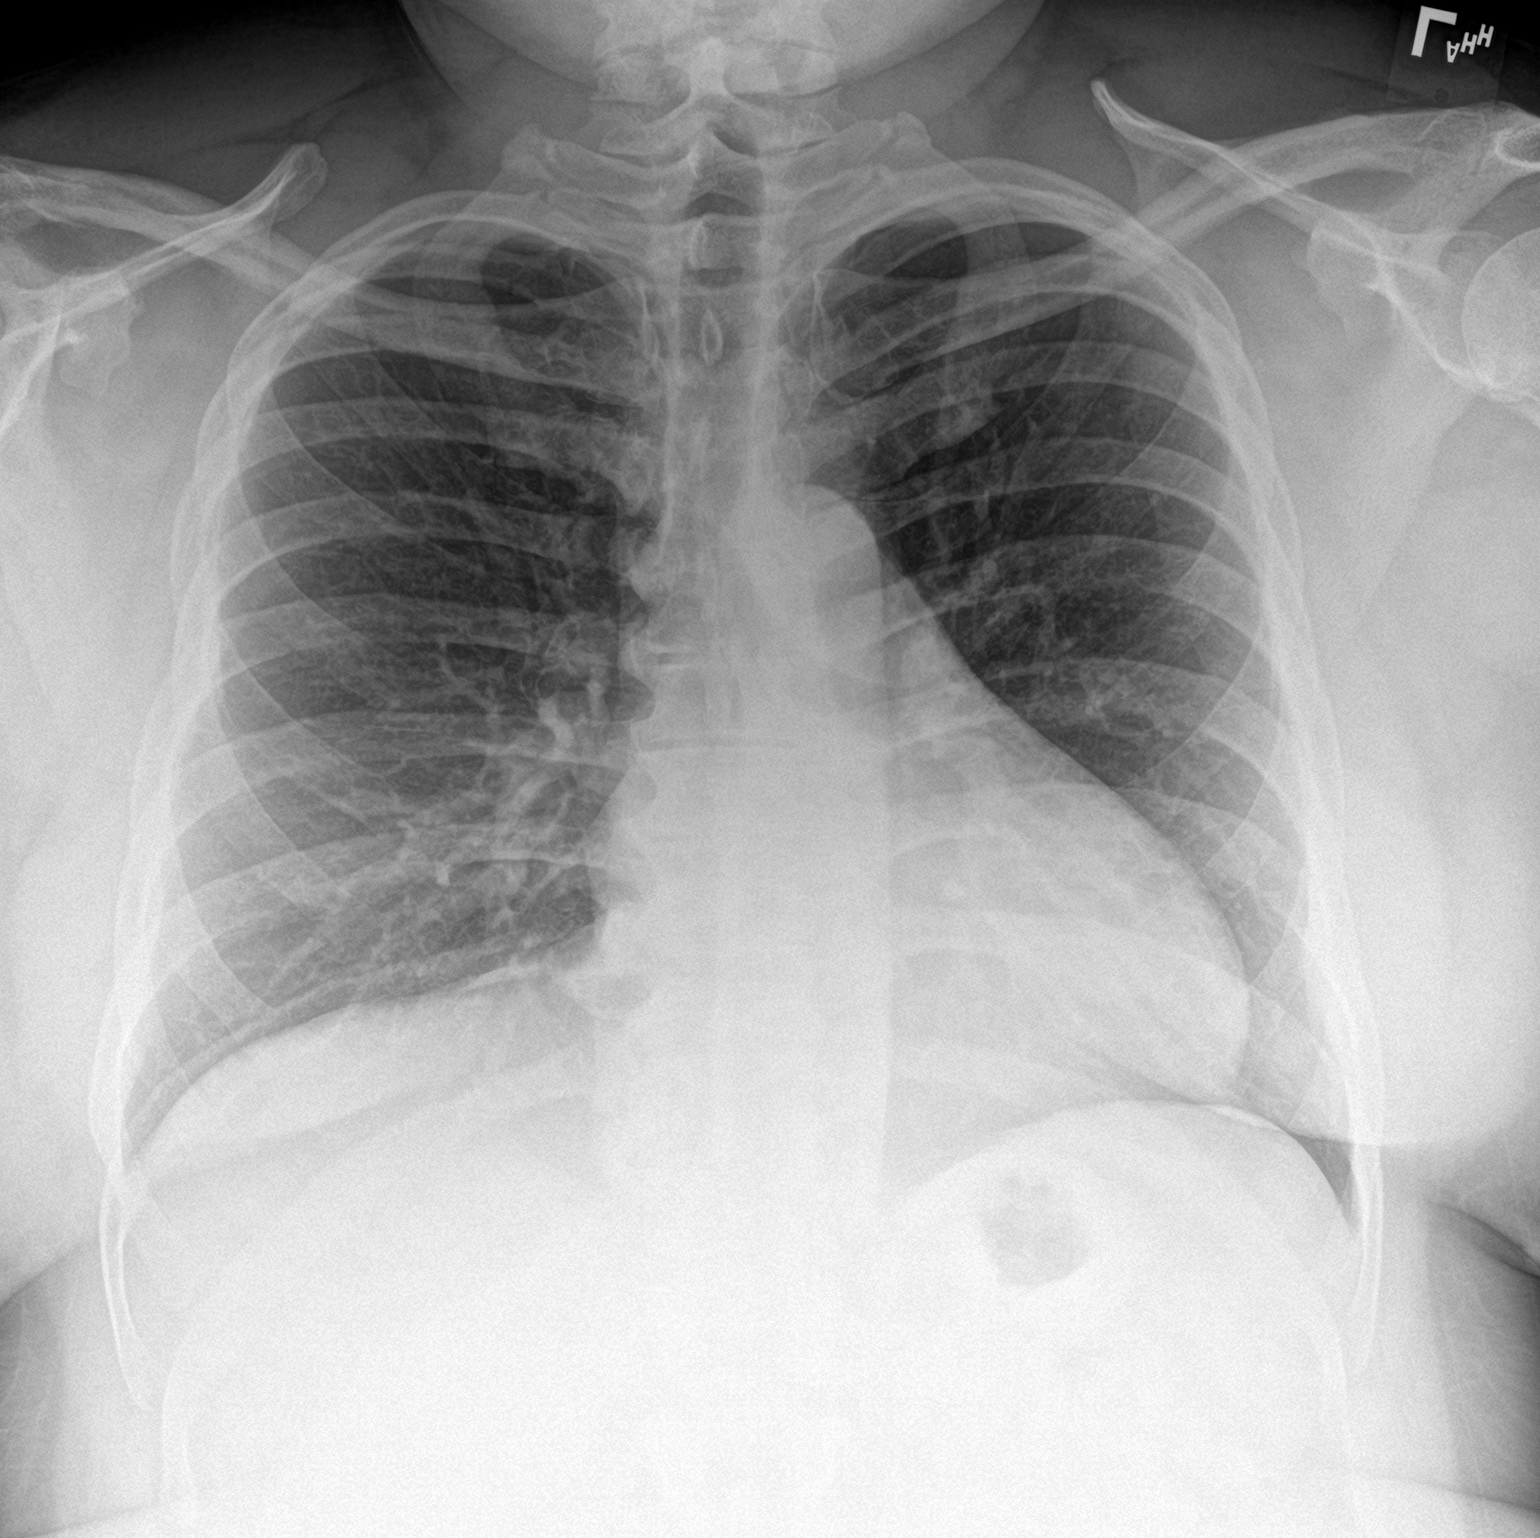

[chest lat]
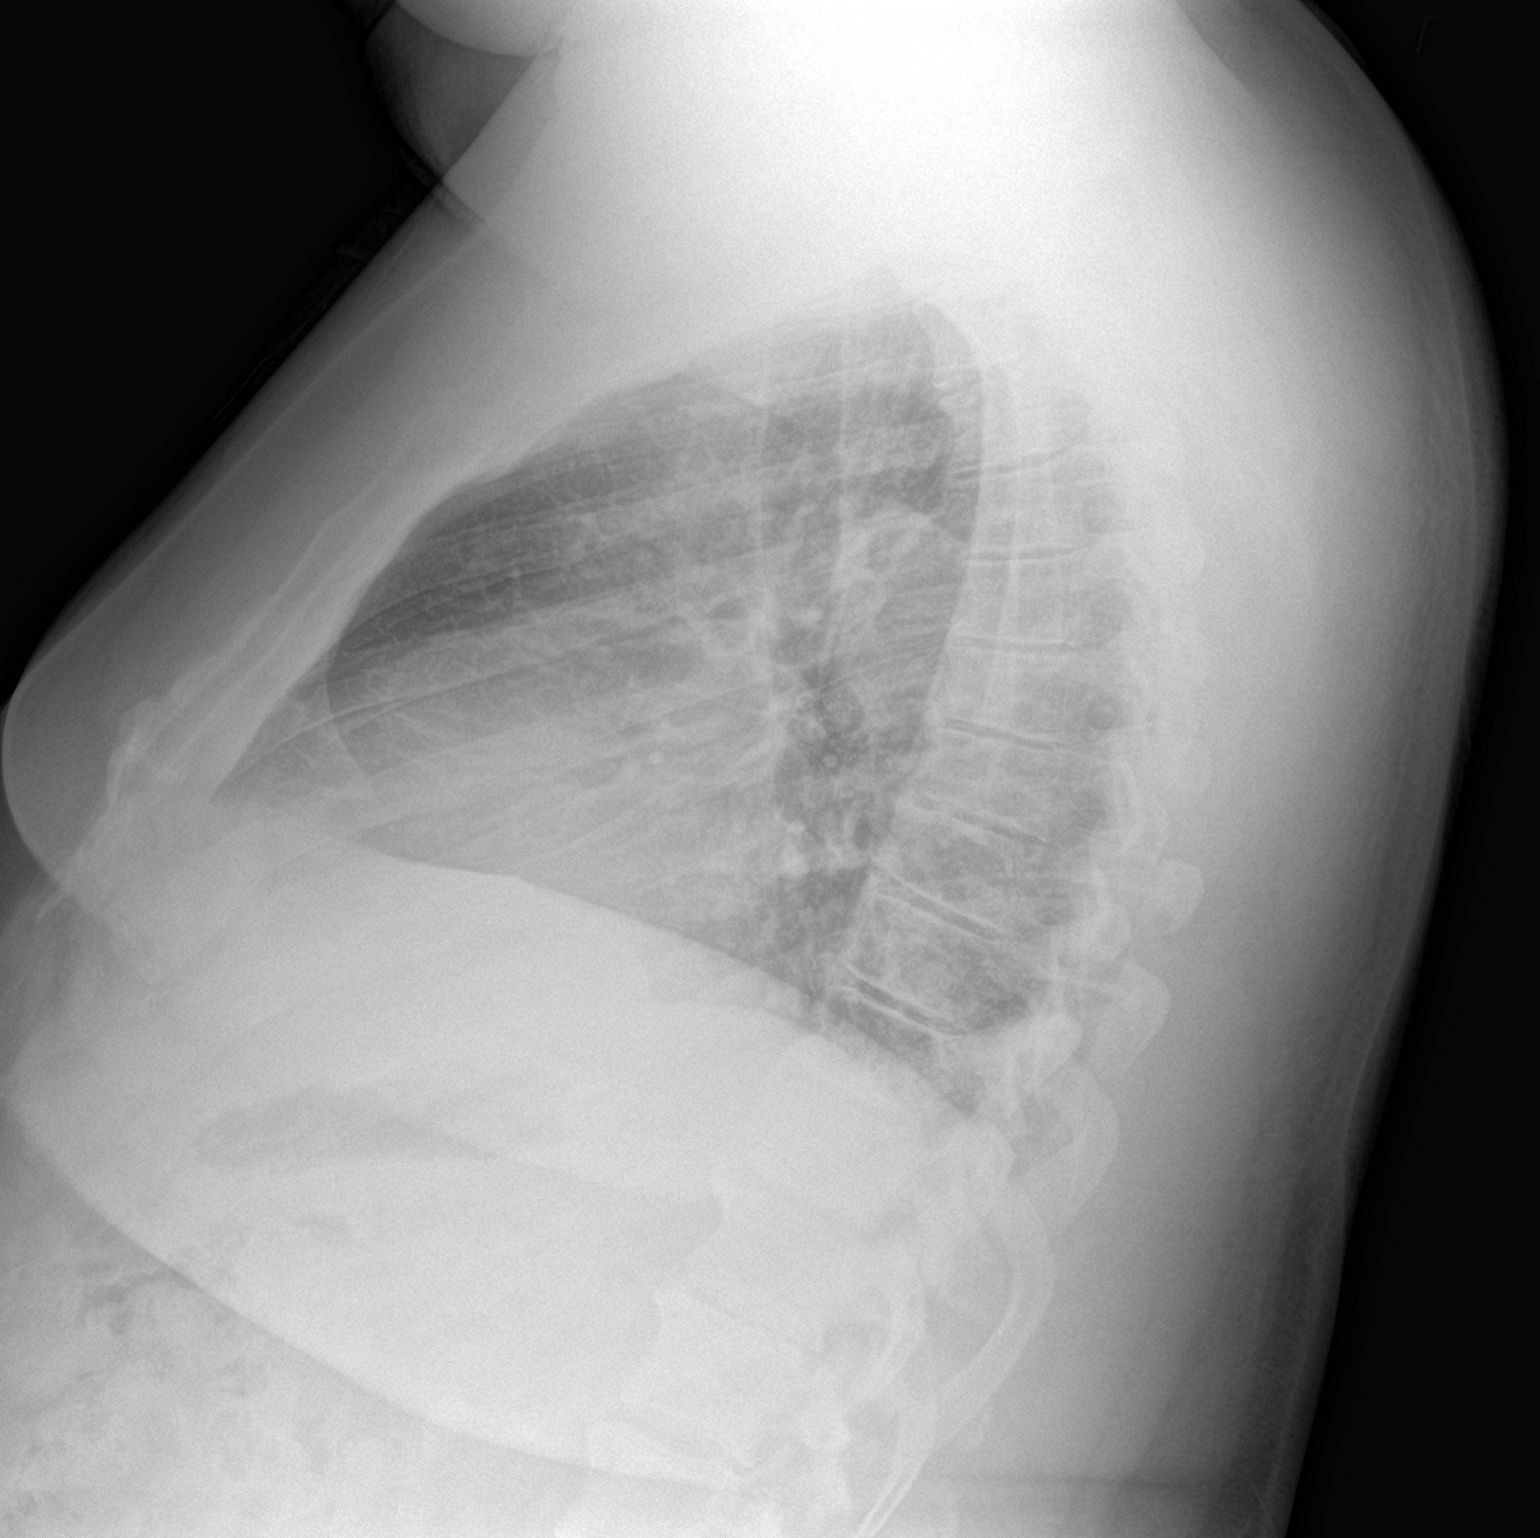

[2 of 2 positions shown; findings below may reference images not displayed]

FINDINGS: The heart size and mediastinal contours are within normal limits.
Both lungs are clear. The visualized skeletal structures are
unremarkable.
IMPRESSION: No active cardiopulmonary disease.

## 2018-12-02 ENCOUNTER — Other Ambulatory Visit: Payer: Self-pay

## 2018-12-02 ENCOUNTER — Ambulatory Visit: Payer: Self-pay

## 2018-12-02 NOTE — Telephone Encounter (Signed)
       Incoming call from Patient who complains of intermittent chest pain.  Onset 1 week ago.  It occurs in the middle of chest.  Does not radiate.       Comes and goes with duration of 10 to 15 seconds.    Pain rated mildly to moderately . Recently started back smoking.  Reports 1 cigarette/ day. Denies any other Sx.    Reviewed protocol, transferred call to office to schedule appointment.      Reason for Disposition . [1] Chest pain(s) lasting a few seconds from coughing AND [2] persists > 3 days  Answer Assessment - Initial Assessment Questions 1. LOCATION: "Where does it hurt?"      In the middle 2. RADIATION: "Does the pain go anywhere else?" (e.g., into neck, jaw, arms, back)     denies 3. ONSET: "When did the chest pain begin?" (Minutes, hours or days)      A week 1/2 ago 4. PATTERN "Does the pain come and go, or has it been constant since it started?"  "Does it get worse with exertion?"      Comes and goes5. DURATION: "How long does it last" (e.g., seconds, minutes, hours)     10 to 15 seconds 6. SEVERITY: "How bad is the pain?"  (e.g., Scale 1-10; mild, moderate, or severe)    - MILD (1-3): doesn't interfere with normal activities     - MODERATE (4-7): interferes with normal activities or awakens from sleep    - SEVERE (8-10): excruciating pain, unable to do any normal activities       moderate 7. CARDIAC RISK FACTORS: "Do you have any history of heart problems or risk factors for heart disease?" (e.g., angina, prior heart attack; diabetes, high blood pressure, high cholesterol, smoker, or strong family history of heart disease)       Smoker yes lately 8. PULMONARY RISK FACTORS: "Do you have any history of lung disease?"  (e.g., blood clots in lung, asthma, emphysema, birth control pills)     denies 9. CAUSE: "What do you think is causing the chest pain?"     ? 10. OTHER SYMPTOMS: "Do you have any other symptoms?" (e.g., dizziness, nausea, vomiting, sweating, fever,  difficulty breathing, cough)      denies 11. PREGNANCY: "Is there any chance you are pregnant?" "When was your last menstrual period?"       na  Protocols used: CHEST PAIN-A-AH

## 2018-12-02 NOTE — Patient Instructions (Signed)
Incoming call from patient with a complaint. Pain located  In the middle of chest. Denies radiation. Onset 1 week ago.  Reports  Duration 10 to 15 sec.  Rated mildly to moderate. Recently started smoking. Reports that he has only had I cigarette per one/day.

## 2018-12-03 ENCOUNTER — Ambulatory Visit: Payer: 59 | Admitting: Family Medicine

## 2018-12-03 ENCOUNTER — Encounter: Payer: Self-pay | Admitting: Family Medicine

## 2018-12-03 VITALS — BP 154/82 | HR 68 | Temp 98.1°F | Ht 71.5 in | Wt 243.2 lb

## 2018-12-03 DIAGNOSIS — I10 Essential (primary) hypertension: Secondary | ICD-10-CM

## 2018-12-03 DIAGNOSIS — R079 Chest pain, unspecified: Secondary | ICD-10-CM | POA: Diagnosis not present

## 2018-12-03 NOTE — Patient Instructions (Signed)
Referral to cardio - they will call you to schedule Trial of prilosec 20mg  daily x 1-2 wks Check BP daily and send results to myself or Dr. Zigmund Daniel in 4-5 days

## 2018-12-03 NOTE — Progress Notes (Signed)
Javier Joyce is a 48 y.o. male  Chief Complaint  Patient presents with  . Pain    chest pain right middle/ no other symptoms/ no Shortness of breath/ does not radiate/ 3 weeks     HPI: Javier Joyce is a 48 y.o. male who is a patient of Dr. Zigmund Daniel who complains of 3 week h/o chest pain in the center of his chest. He describes pain as a "squeeze", lasts a few seconds then resolves. He states it "hits" and "it catches [my] breath". He can have a few of these episodes per day and some days not at all. No symptoms today.  No radiation of pain. No n/v. No diaphoresis. No fatigue. No pain with inspiration or exhalation. No pain with movement. No pain with palpation.  No change in activity level. No changes in diet or quantity of food. No heartburn or indigestion. Pt does have a h/o bariatric surgery  Pt does have a h/o HTN and is on diltiazem 120mg  daily. He has side effects with lisinopril and losartan.  Pt states in 2004 or 2005 he had similar symptoms - did stress test and echo - pt thinks he was told he has a "slightly enlarged heart".  Pt is smoker. He quit briefly but smokes 1-2 cig per day.  Fam hx - mom (CHF and ? MI), MGF with fatal MI around 49yo   The 10-year ASCVD risk score Mikey Bussing DC Jr., et al., 2013) is: 10.6%   Values used to calculate the score:     Age: 84 years     Sex: Male     Is Non-Hispanic African American: Yes     Diabetic: No     Tobacco smoker: No     Systolic Blood Pressure: 400 mmHg     Is BP treated: Yes     HDL Cholesterol: 65.5 mg/dL     Total Cholesterol: 189 mg/dL   Past Medical History:  Diagnosis Date  . Complication of anesthesia    Pt has woken up during the procedure  . Hypertension   . Sleep apnea     Past Surgical History:  Procedure Laterality Date  . ACHILLES TENDON REPAIR Bilateral 2008  . LAPAROSCOPIC GASTRIC SLEEVE RESECTION N/A 09/15/2017   Procedure: LAPAROSCOPIC GASTRIC SLEEVE RESECTION WITH UPPER ENDO AND ERAS PATHWAY;   Surgeon: Johnathan Hausen, MD;  Location: [facility];  Service: General;  Laterality: N/A;  . PATELLAR TENDON REPAIR Bilateral   . right and left knee repair      Social History   Socioeconomic History  . Marital status: Married    Spouse name: Not on file  . Number of children: Not on file  . Years of education: Not on file  . Highest education level: Not on file  Occupational History  . Not on file  Social Needs  . Financial resource strain: Not on file  . Food insecurity    Worry: Never true    Inability: Never true  . Transportation needs    Medical: Not on file    Non-medical: Not on file  Tobacco Use  . Smoking status: Former Smoker    Years: 2.00    Quit date: 08/24/2017    Years since quitting: 1.2  . Smokeless tobacco: Never Used  . Tobacco comment: Social smoker  Substance and Sexual Activity  . Alcohol use: Yes    Comment: occass  . Drug use: Never  . Sexual activity: Yes  Lifestyle  .  Physical activity    Days per week: Not on file    Minutes per session: Not on file  . Stress: Not on file  Relationships  . Social Herbalist on phone: Not on file    Gets together: Not on file    Attends religious service: Not on file    Active member of club or organization: Not on file    Attends meetings of clubs or organizations: Not on file    Relationship status: Not on file  . Intimate partner violence    Fear of current or ex partner: Not on file    Emotionally abused: Not on file    Physically abused: Not on file    Forced sexual activity: Not on file  Other Topics Concern  . Not on file  Social History Narrative  . Not on file    No family history on file.    There is no immunization history on file for this patient.  Outpatient Encounter Medications as of 12/03/2018  Medication Sig  . diltiazem (TIAZAC) 120 MG 24 hr capsule   . ketotifen (ZADITOR) 0.025 % ophthalmic solution Place 1 drop into both eyes every 4 (four) hours as needed (itchy  eyes).  . NASCOBAL 500 MCG/0.1ML SOLN   . sildenafil (VIAGRA) 100 MG tablet Take 1 tablet (100 mg total) by mouth daily as needed for erectile dysfunction.  . [DISCONTINUED] oseltamivir (TAMIFLU) 75 MG capsule Take 1 capsule (75 mg total) by mouth daily.  . [DISCONTINUED] valsartan (DIOVAN) 80 MG tablet Take 1 tablet (80 mg total) by mouth daily.   No facility-administered encounter medications on file as of 12/03/2018.      ROS: Pertinent positives and negatives noted in HPI. Remainder of ROS non-contributory  Allergies  Allergen Reactions  . Lisinopril Cough  . Losartan     Pt has lots of swelling if he does not have diuretic with this medication     BP (!) 154/82   Pulse 68   Temp 98.1 F (36.7 C) (Oral)   Ht 5' 11.5" (1.816 m)   Wt 243 lb 3.2 oz (110.3 kg)   SpO2 98%   BMI 33.45 kg/m   BP Readings from Last 3 Encounters:  12/03/18 (!) 154/82  06/14/18 132/66  06/10/18 (!) 144/78   Pulse Readings from Last 3 Encounters:  12/03/18 68  06/14/18 68  06/10/18 73    Physical Exam  Constitutional: He is oriented to person, place, and time. He appears well-developed and well-nourished. No distress.  Neck: No JVD present. Carotid bruit is not present.  Cardiovascular: Normal rate, regular rhythm, normal heart sounds and intact distal pulses.  No murmur heard. Pulmonary/Chest: Effort normal and breath sounds normal. No respiratory distress. He exhibits no tenderness.  Abdominal: Soft. Bowel sounds are normal. He exhibits no distension. There is no abdominal tenderness.  Musculoskeletal:        General: No edema.  Neurological: He is alert and oriented to person, place, and time.  Skin: Skin is warm and dry.  Psychiatric: He has a normal mood and affect. His behavior is normal.     EKG - sinus brady @ 57bpm, no acute ST/T changes, no Q waves   A/P:  1. Chest pain, unspecified type - atypical chest pain based on location and duration of pain. Normal EKG. Pt does  have a fam h/o CVD (mother and MGF with MI) and he is a smoker with HTN. Pt is concerned about  cardiac etiology. Pain could be d/t silent reflux/GERD, doubt costochondritis.  - EKG 12-Lead - Ambulatory referral to Cardiology - trial of prilosec daily x 1-2 wks - f/u PRN  2. HTN, essential - BP is elevated today, pt is taking diltiazem as Rx'd - check BP at home daily and send results via MyChart to PCP or myself in 5-7 days, sooner PRN - low sodium diet  Discussed plan and reviewed medications with patient, including risks, benefits, and potential side effects. Pt expressed understand. All questions answered.

## 2018-12-20 ENCOUNTER — Encounter: Payer: Self-pay | Admitting: Family Medicine

## 2018-12-20 ENCOUNTER — Ambulatory Visit: Payer: 59 | Admitting: Family Medicine

## 2018-12-20 VITALS — BP 170/100 | HR 76 | Temp 98.2°F | Ht 71.5 in | Wt 238.6 lb

## 2018-12-20 DIAGNOSIS — F329 Major depressive disorder, single episode, unspecified: Secondary | ICD-10-CM | POA: Insufficient documentation

## 2018-12-20 DIAGNOSIS — M25562 Pain in left knee: Secondary | ICD-10-CM | POA: Diagnosis not present

## 2018-12-20 DIAGNOSIS — M25561 Pain in right knee: Secondary | ICD-10-CM | POA: Diagnosis not present

## 2018-12-20 DIAGNOSIS — I10 Essential (primary) hypertension: Secondary | ICD-10-CM | POA: Diagnosis not present

## 2018-12-20 DIAGNOSIS — G8929 Other chronic pain: Secondary | ICD-10-CM

## 2018-12-20 DIAGNOSIS — F320 Major depressive disorder, single episode, mild: Secondary | ICD-10-CM | POA: Diagnosis not present

## 2018-12-20 MED ORDER — OLMESARTAN MEDOXOMIL-HCTZ 20-12.5 MG PO TABS: 1.0000 | ORAL_TABLET | Freq: Every day | ORAL | 1 refills | Status: DC

## 2018-12-20 MED ORDER — PENNSAID 2 % TD SOLN: 2.0000 | Freq: Two times a day (BID) | TRANSDERMAL | 2 refills | Status: DC

## 2018-12-20 MED ORDER — ESCITALOPRAM OXALATE 10 MG PO TABS: ORAL_TABLET | ORAL | 3 refills | Status: DC

## 2018-12-20 NOTE — Progress Notes (Signed)
Javier Joyce - 48 y.o. male MRN [id number]  Date of birth: December 29, 1970  Subjective Chief Complaint  Patient presents with  . Pain    both knee pain/ 3 weeks/ shots wore off/ wants to discuss new injections     HPI Javier Joyce is a 48 y.o. male with history of HTN, OSA, Laparscopic sleeve gastrectomy and chronic knee pain here today to f/u on knee pain.  Also would like to discuss depression.  -Knee pain:  Reports continued bilateral knee pain.  Pain is worse when up and walking.  Describes burning sensation in both knees.  Has prior history of bilateral patellar tendon rupture with surgical repair during [employer].  Had had knee injections previously which were working well initially however most recent injection 05/2018 only lasted about 3-4 weeks before pain returned.  He has been taking ibuprofen but has since stopped this after having a nosebleed.  He denies swelling or locking sensation of the knee.   -HTN:  BP elevated today and at previous visit.  He is currently treated with diltiazem which he had been on for several years, started by [place] doctor in the past.  He had been on losartan as well but he stopped this due to previous recall.  He was seen recently by Dr. Bryan Lemma for chest pain which was atypical in nature.  EKG unremarkable at that time.   He continues to have brief episodes of sharp chest pain and has been referred to cardiology.  He denies associated shortness of breath.  -Depression:  He reports increased feeling of depression related to pain and other stressors at home.  He is interested in starting medication for this.  Reports typically he eats in response to his stress however with sleeve gastrectomy this causes him more pain and nausea.  He denies SI/HI a this time.    ROS:  A comprehensive ROS was completed and negative except as noted per HPI    Allergies  Allergen Reactions  . Lisinopril Cough  . Losartan     Pt has lots of swelling if he does not  have diuretic with this medication     Past Medical History:  Diagnosis Date  . Complication of anesthesia    Pt has woken up during the procedure  . Hypertension   . Sleep apnea     Past Surgical History:  Procedure Laterality Date  . ACHILLES TENDON REPAIR Bilateral 2008  . LAPAROSCOPIC GASTRIC SLEEVE RESECTION N/A 09/15/2017   Procedure: LAPAROSCOPIC GASTRIC SLEEVE RESECTION WITH UPPER ENDO AND ERAS PATHWAY;  Surgeon: Johnathan Hausen, MD;  Location: [facility];  Service: General;  Laterality: N/A;  . PATELLAR TENDON REPAIR Bilateral   . right and left knee repair      Social History   Socioeconomic History  . Marital status: Married    Spouse name: Not on file  . Number of children: Not on file  . Years of education: Not on file  . Highest education level: Not on file  Occupational History  . Not on file  Social Needs  . Financial resource strain: Not on file  . Food insecurity    Worry: Never true    Inability: Never true  . Transportation needs    Medical: Not on file    Non-medical: Not on file  Tobacco Use  . Smoking status: Former Smoker    Years: 2.00    Quit date: 08/24/2017    Years since quitting: 1.3  . Smokeless  tobacco: Never Used  . Tobacco comment: Social smoker  Substance and Sexual Activity  . Alcohol use: Yes    Comment: occass  . Drug use: Never  . Sexual activity: Yes  Lifestyle  . Physical activity    Days per week: Not on file    Minutes per session: Not on file  . Stress: Not on file  Relationships  . Social Herbalist on phone: Not on file    Gets together: Not on file    Attends religious service: Not on file    Active member of club or organization: Not on file    Attends meetings of clubs or organizations: Not on file    Relationship status: Not on file  Other Topics Concern  . Not on file  Social History Narrative  . Not on file    No family history on file.  Health Maintenance  Topic Date Due  . INFLUENZA  VACCINE  11/20/2018  . TETANUS/TDAP  04/21/2026  . HIV Screening  Completed    ----------------------------------------------------------------------------------------------------------------------------------------------------------------------------------------------------------------- Physical Exam BP (!) 170/100   Pulse 76   Temp 98.2 F (36.8 C) (Oral)   Ht 5' 11.5" (1.816 m)   Wt 238 lb 9.6 oz (108.2 kg)   SpO2 98%   BMI 32.81 kg/m   Physical Exam Constitutional:      Appearance: Normal appearance.  HENT:     Head: Normocephalic and atraumatic.     Right Ear: Tympanic membrane normal.     Left Ear: Tympanic membrane normal.     Mouth/Throat:     Mouth: Mucous membranes are moist.  Eyes:     General: No scleral icterus. Neck:     Musculoskeletal: Neck supple.  Cardiovascular:     Rate and Rhythm: Normal rate and regular rhythm.  Pulmonary:     Effort: Pulmonary effort is normal.     Breath sounds: Normal breath sounds.  Musculoskeletal:     Comments: Bilateral knees with anterior scar from prior patellar tendon repair.  No effusion noted TTP along joint lines and with patellar compression bilaterally.  ROM is normal.   Skin:    General: Skin is warm and dry.  Neurological:     General: No focal deficit present.     Mental Status: He is alert.  Psychiatric:        Mood and Affect: Mood normal.        Behavior: Behavior normal.     ------------------------------------------------------------------------------------------------------------------------------------------------------------------------------------------------------------------- Assessment and Plan  Essential hypertension -BP elevated today.  Add on benicar-hct and continue current dose of diltiazem.  If BP still not well controlled will plan to switch diltiazem to amlodipine as I think this would provide better control of hypertension.   -Follow low salt diet.    Bilateral knee pain -Start  pennsaid bid.  -Referral made to sports medicine for repeat injection and/or consideration for visco supplementation.    MDD (major depressive disorder), single episode -Start lexapro 5mg  x1 week then increase to 10mg .  -Reviewed side effects including nausea when first starting.  -Return in about 4 weeks (around 01/17/2019) for HTN/MDD.

## 2018-12-20 NOTE — Assessment & Plan Note (Signed)
-  Start pennsaid bid.  -Referral made to sports medicine for repeat injection and/or consideration for visco supplementation.

## 2018-12-20 NOTE — Assessment & Plan Note (Signed)
-  Start lexapro 5mg  x1 week then increase to 10mg .  -Reviewed side effects including nausea when first starting.  -Return in about 4 weeks (around 01/17/2019) for HTN/MDD.

## 2018-12-20 NOTE — Patient Instructions (Signed)
Start benicar-hct for BP Start lexapro for depression.  See me again in 4 weeks for f/u BP and depression  For the knees:  I have placed a referral to sports medicine.  I have also sent in a prescription for Pennsaid.  Use coupon with this.   Please call if you have questions.

## 2018-12-20 NOTE — Assessment & Plan Note (Signed)
-  BP elevated today.  Add on benicar-hct and continue current dose of diltiazem.  If BP still not well controlled will plan to switch diltiazem to amlodipine as I think this would provide better control of hypertension.   -Follow low salt diet.

## 2018-12-28 ENCOUNTER — Ambulatory Visit: Payer: 59 | Admitting: Family Medicine

## 2018-12-28 ENCOUNTER — Encounter: Payer: Self-pay | Admitting: Family Medicine

## 2018-12-28 ENCOUNTER — Ambulatory Visit (HOSPITAL_BASED_OUTPATIENT_CLINIC_OR_DEPARTMENT_OTHER)
Admission: RE | Admit: 2018-12-28 | Discharge: 2018-12-28 | Disposition: A | Discharge status: Home / Self Care | Payer: 59 | Source: Ambulatory Visit | Attending: Family Medicine | Admitting: Family Medicine

## 2018-12-28 ENCOUNTER — Other Ambulatory Visit: Payer: Self-pay

## 2018-12-28 VITALS — BP 162/94 | Ht 72.0 in | Wt 232.0 lb

## 2018-12-28 DIAGNOSIS — M25562 Pain in left knee: Secondary | ICD-10-CM | POA: Insufficient documentation

## 2018-12-28 DIAGNOSIS — M25561 Pain in right knee: Secondary | ICD-10-CM

## 2018-12-28 DIAGNOSIS — G8929 Other chronic pain: Secondary | ICD-10-CM

## 2018-12-28 NOTE — Progress Notes (Signed)
Javier Joyce - 48 y.o. male MRN [id number]  Date of birth: 1970-09-25  SUBJECTIVE:  Including CC & ROS.  Chief Complaint  Patient presents with  . Knee Pain    bilateral knee    Javier Joyce is a 48 y.o. male that is presenting with acute on chronic bilateral knee pain.  He has a history of bilateral tendon ruptures and repair.  He is tried several steroid injections with the most recent giving him limited improvement.  The pain is anterior and medial in nature.  The pain is moderate to severe.  It is worse with certain movements.  It is throbbing in nature.  He is tried several medications with limited improvement.  Denies any mechanical symptoms.  Has tried braces before.  Pain seems to be localized to the knee.   Review of Systems  Constitutional: Negative for fever.  HENT: Negative for congestion.   Respiratory: Negative for cough.   Cardiovascular: Negative for chest pain.  Gastrointestinal: Negative for abdominal pain.  Musculoskeletal: Positive for arthralgias.  Neurological: Negative for weakness.  Hematological: Negative for adenopathy.    HISTORY: Past Medical, Surgical, Social, and Family History Reviewed & Updated per EMR.   Pertinent Historical Findings include:  Past Medical History:  Diagnosis Date  . Complication of anesthesia    Pt has woken up during the procedure  . Hypertension   . Sleep apnea     Past Surgical History:  Procedure Laterality Date  . ACHILLES TENDON REPAIR Bilateral 2008  . LAPAROSCOPIC GASTRIC SLEEVE RESECTION N/A 09/15/2017   Procedure: LAPAROSCOPIC GASTRIC SLEEVE RESECTION WITH UPPER ENDO AND ERAS PATHWAY;  Surgeon: Johnathan Hausen, MD;  Location: [facility];  Service: General;  Laterality: N/A;  . PATELLAR TENDON REPAIR Bilateral   . right and left knee repair      Allergies  Allergen Reactions  . Lisinopril Cough  . Losartan     Pt has lots of swelling if he does not have diuretic with this medication     No family history on  file.   Social History   Socioeconomic History  . Marital status: Married    Spouse name: Not on file  . Number of children: Not on file  . Years of education: Not on file  . Highest education level: Not on file  Occupational History  . Not on file  Social Needs  . Financial resource strain: Not on file  . Food insecurity    Worry: Never true    Inability: Never true  . Transportation needs    Medical: Not on file    Non-medical: Not on file  Tobacco Use  . Smoking status: Former Smoker    Years: 2.00    Quit date: 08/24/2017    Years since quitting: 1.3  . Smokeless tobacco: Never Used  . Tobacco comment: Social smoker  Substance and Sexual Activity  . Alcohol use: Yes    Comment: occass  . Drug use: Never  . Sexual activity: Yes  Lifestyle  . Physical activity    Days per week: Not on file    Minutes per session: Not on file  . Stress: Not on file  Relationships  . Social Herbalist on phone: Not on file    Gets together: Not on file    Attends religious service: Not on file    Active member of club or organization: Not on file    Attends meetings of clubs or organizations: Not  on file    Relationship status: Not on file  . Intimate partner violence    Fear of current or ex partner: Not on file    Emotionally abused: Not on file    Physically abused: Not on file    Forced sexual activity: Not on file  Other Topics Concern  . Not on file  Social History Narrative  . Not on file     PHYSICAL EXAM:  VS: BP (!) 162/94   Ht 6' (1.829 m)   Wt 232 lb (105.2 kg)   BMI 31.46 kg/m  Physical Exam Gen: NAD, alert, cooperative with exam, well-appearing ENT: normal lips, normal nasal mucosa,  Eye: normal EOM, normal conjunctiva and lids CV:  no edema, +2 pedal pulses   Resp: no accessory muscle use, non-labored,  Skin: no rashes, no areas of induration  Neuro: normal tone, normal sensation to touch Psych:  normal insight, alert and oriented MSK:   Right left knee: Well-healed vertical incisions over the anterior knees bilaterally Normal range of motion. Some tenderness palpation over the medial joint line. Instability to valgus and varus stress testing. Normal strength to resistance. Neck McMurray's test. Neurovascular intact     ASSESSMENT & PLAN:   Bilateral knee pain Has had injuries to his knees previously as well as reconstruction of the patellar tendon bilaterally.  Has not had x-rays done in some time.  Likely related to degenerative changes.  Has tried steroid injection several times with limited improvement most recently. -Counseled on home exercise therapy and supportive care. -X-ray. - may pursue surgery or gel injections

## 2018-12-28 NOTE — Progress Notes (Deleted)
Cardiology Office Note:    Date:  12/28/2018   ID:  Javier Joyce, DOB 06/22/1970, MRN [id number]  PCP:  Luetta Nutting, DO  Cardiologist:  No primary care provider on file.  Electrophysiologist:  None   Referring MD: Ronnald Nian, DO   No chief complaint on file. ***  History of Present Illness:    Javier Joyce is a 48 y.o. male with a hx of hypertension, OSA who is referred by Dr. Bryan Lemma for evaluation of chest pain.  Past Medical History:  Diagnosis Date  . Complication of anesthesia    Pt has woken up during the procedure  . Hypertension   . Sleep apnea     Past Surgical History:  Procedure Laterality Date  . ACHILLES TENDON REPAIR Bilateral 2008  . LAPAROSCOPIC GASTRIC SLEEVE RESECTION N/A 09/15/2017   Procedure: LAPAROSCOPIC GASTRIC SLEEVE RESECTION WITH UPPER ENDO AND ERAS PATHWAY;  Surgeon: Johnathan Hausen, MD;  Location: [facility];  Service: General;  Laterality: N/A;  . PATELLAR TENDON REPAIR Bilateral   . right and left knee repair      Current Medications: No outpatient medications have been marked as taking for the 12/29/18 encounter (Appointment) with Donato Heinz, MD.     Allergies:   Lisinopril and Losartan   Social History   Socioeconomic History  . Marital status: Married    Spouse name: Not on file  . Number of children: Not on file  . Years of education: Not on file  . Highest education level: Not on file  Occupational History  . Not on file  Social Needs  . Financial resource strain: Not on file  . Food insecurity    Worry: Never true    Inability: Never true  . Transportation needs    Medical: Not on file    Non-medical: Not on file  Tobacco Use  . Smoking status: Former Smoker    Years: 2.00    Quit date: 08/24/2017    Years since quitting: 1.3  . Smokeless tobacco: Never Used  . Tobacco comment: Social smoker  Substance and Sexual Activity  . Alcohol use: Yes    Comment: occass  . Drug use: Never  . Sexual  activity: Yes  Lifestyle  . Physical activity    Days per week: Not on file    Minutes per session: Not on file  . Stress: Not on file  Relationships  . Social Herbalist on phone: Not on file    Gets together: Not on file    Attends religious service: Not on file    Active member of club or organization: Not on file    Attends meetings of clubs or organizations: Not on file    Relationship status: Not on file  Other Topics Concern  . Not on file  Social History Narrative  . Not on file     Family History: The patient's ***family history is not on file.  ROS:   Please see the history of present illness.    *** All other systems reviewed and are negative.  EKGs/Labs/Other Studies Reviewed:    The following studies were reviewed today: ***  EKG:  EKG is *** ordered today.  The ekg ordered today demonstrates ***  Recent Labs: 06/14/2018: ALT 15; BUN 15; Creatinine, Ser 1.15; Hemoglobin 15.1; Platelets 201.0; Potassium 4.4; Sodium 138; TSH 1.56  Recent Lipid Panel    Component Value Date/Time   CHOL 189 06/14/2018 0917  TRIG 63.0 06/14/2018 0917   HDL 65.50 06/14/2018 0917   CHOLHDL 3 06/14/2018 0917   VLDL 12.6 06/14/2018 0917   LDLCALC 111 (H) 06/14/2018 0917    Physical Exam:    VS:  There were no vitals taken for this visit.    Wt Readings from Last 3 Encounters:  12/28/18 232 lb (105.2 kg)  12/20/18 238 lb 9.6 oz (108.2 kg)  12/03/18 243 lb 3.2 oz (110.3 kg)     GEN: *** Well nourished, well developed in no acute distress HEENT: Normal NECK: No JVD; No carotid bruits LYMPHATICS: No lymphadenopathy CARDIAC: ***RRR, no murmurs, rubs, gallops RESPIRATORY:  Clear to auscultation without rales, wheezing or rhonchi  ABDOMEN: Soft, non-tender, non-distended MUSCULOSKELETAL:  No edema; No deformity  SKIN: Warm and dry NEUROLOGIC:  Alert and oriented x 3 PSYCHIATRIC:  Normal affect   ASSESSMENT:    No diagnosis found. PLAN:    In order of  problems listed above:  Chest pain:  Hypertension: On diltiazem 120 mg daily, olmesartan-hydrochlorothiazide 20-12.5  Tobacco use:   Medication Adjustments/Labs and Tests Ordered: Current medicines are reviewed at length with the patient today.  Concerns regarding medicines are outlined above.  No orders of the defined types were placed in this encounter.  No orders of the defined types were placed in this encounter.   There are no Patient Instructions on file for this visit.   Signed, Donato Heinz, MD  12/28/2018 11:09 PM    [place]

## 2018-12-28 NOTE — Patient Instructions (Signed)
Nice to meet you Please try ice  Please try the exercises   Please send me a message in MyChart with any questions or updates.  I will call you with the results from today and discuss follow up.   --Dr. Raeford Razor

## 2018-12-28 NOTE — Assessment & Plan Note (Signed)
Has had injuries to his knees previously as well as reconstruction of the patellar tendon bilaterally.  Has not had x-rays done in some time.  Likely related to degenerative changes.  Has tried steroid injection several times with limited improvement most recently. -Counseled on home exercise therapy and supportive care. -X-ray. - may pursue surgery or gel injections

## 2018-12-29 ENCOUNTER — Telehealth: Payer: Self-pay | Admitting: Family Medicine

## 2018-12-29 ENCOUNTER — Ambulatory Visit: Payer: 59 | Admitting: Cardiology

## 2018-12-29 MED ORDER — PENNSAID 2 % TD SOLN: 1.0000 "application " | Freq: Two times a day (BID) | TRANSDERMAL | 3 refills | Status: DC

## 2018-12-29 NOTE — Telephone Encounter (Signed)
Spoke with patient about xray results. Resent pennsaid. Will try gel injections.   Rosemarie Ax, MD [facility] 12/29/2018, 1:43 PM

## 2019-01-06 ENCOUNTER — Ambulatory Visit: Payer: 59 | Admitting: Family Medicine

## 2019-01-06 NOTE — Progress Notes (Deleted)
Javier Joyce - 48 y.o. male MRN [id number]  Date of birth: 29-Dec-1970  SUBJECTIVE:  Including CC & ROS.  No chief complaint on file.   Javier Joyce is a 48 y.o. male that is  ***.  ***   Review of Systems  HISTORY: Past Medical, Surgical, Social, and Family History Reviewed & Updated per EMR.   Pertinent Historical Findings include:  Past Medical History:  Diagnosis Date  . Complication of anesthesia    Pt has woken up during the procedure  . Hypertension   . Sleep apnea     Past Surgical History:  Procedure Laterality Date  . ACHILLES TENDON REPAIR Bilateral 2008  . LAPAROSCOPIC GASTRIC SLEEVE RESECTION N/A 09/15/2017   Procedure: LAPAROSCOPIC GASTRIC SLEEVE RESECTION WITH UPPER ENDO AND ERAS PATHWAY;  Surgeon: Johnathan Hausen, MD;  Location: [facility];  Service: General;  Laterality: N/A;  . PATELLAR TENDON REPAIR Bilateral   . right and left knee repair      Allergies  Allergen Reactions  . Lisinopril Cough  . Losartan     Pt has lots of swelling if he does not have diuretic with this medication     No family history on file.   Social History   Socioeconomic History  . Marital status: Married    Spouse name: Not on file  . Number of children: Not on file  . Years of education: Not on file  . Highest education level: Not on file  Occupational History  . Not on file  Social Needs  . Financial resource strain: Not on file  . Food insecurity    Worry: Never true    Inability: Never true  . Transportation needs    Medical: Not on file    Non-medical: Not on file  Tobacco Use  . Smoking status: Former Smoker    Years: 2.00    Quit date: 08/24/2017    Years since quitting: 1.3  . Smokeless tobacco: Never Used  . Tobacco comment: Social smoker  Substance and Sexual Activity  . Alcohol use: Yes    Comment: occass  . Drug use: Never  . Sexual activity: Yes  Lifestyle  . Physical activity    Days per week: Not on file    Minutes per session: Not on  file  . Stress: Not on file  Relationships  . Social Herbalist on phone: Not on file    Gets together: Not on file    Attends religious service: Not on file    Active member of club or organization: Not on file    Attends meetings of clubs or organizations: Not on file    Relationship status: Not on file  . Intimate partner violence    Fear of current or ex partner: Not on file    Emotionally abused: Not on file    Physically abused: Not on file    Forced sexual activity: Not on file  Other Topics Concern  . Not on file  Social History Narrative  . Not on file     PHYSICAL EXAM:  VS: There were no vitals taken for this visit. Physical Exam Gen: NAD, alert, cooperative with exam, well-appearing   Aspiration/Injection Procedure Note Javier Joyce 11/27/1970  Procedure: {aspiration/injection:3041535} Indications: ***  Procedure Details Consent: {consent:3041460} Time Out: Verified patient identification, verified procedure, site/side was marked, verified correct patient position, special equipment/implants available, medications/allergies/relevent history reviewed, required imaging and test results available.  {time out performed:3041467}.  The area was cleaned with iodine and alcohol swabs.    The *** knee superior lateral suprapatellar pouch was injected using 4 cc's of 1% lidocaine with a 22 1 1/2" needle.  The syringe was switched and a 16.8 mg/2 mL of Gelsyn 3 was injected. Ultrasound was used. Images were obtained in  Long views showing the injection.    Amount of Fluid Aspirated: {fluid amount:3041537} Character of Fluid: {fluid characteristics:3041536} Fluid was sent for:{fluid sent:3041539} A sterile dressing was applied.  Patient {did/not:14019} tolerate procedure well.   Aspiration/Injection Procedure Note Javier Joyce 12/15/1970  Procedure: {aspiration/injection:3041535} Indications: ***  Procedure Details Consent: {consent:3041460}  Time Out: Verified patient identification, verified procedure, site/side was marked, verified correct patient position, special equipment/implants available, medications/allergies/relevent history reviewed, required imaging and test results available.  {time out performed:3041467}.  The area was cleaned with iodine and alcohol swabs.    The *** knee superior lateral suprapatellar pouch was injected using 4 cc's of 1% lidocaine with a 22 1 1/2" needle.  The syringe was switched and a 16.8 mg/2 mL of Gelsyn 3 was injected. Ultrasound was used. Images were obtained in  Long views showing the injection.    Amount of Fluid Aspirated: {fluid amount:3041537} Character of Fluid: {fluid characteristics:3041536} Fluid was sent for:{fluid sent:3041539} A sterile dressing was applied.  Patient {did/not:14019} tolerate procedure well.      ASSESSMENT & PLAN:   No problem-specific Assessment & Plan notes found for this encounter.

## 2019-01-21 ENCOUNTER — Ambulatory Visit: Payer: 59 | Admitting: Family Medicine

## 2019-01-21 NOTE — Progress Notes (Deleted)
DRAGAN JAUREQUI - 48 y.o. male MRN [id number]  Date of birth: July 24, 1970  SUBJECTIVE:  Including CC & ROS.  No chief complaint on file.   MARTINO BAJOR is a 47 y.o. male that is  ***.  ***   Review of Systems  HISTORY: Past Medical, Surgical, Social, and Family History Reviewed & Updated per EMR.   Pertinent Historical Findings include:  Past Medical History:  Diagnosis Date  . Complication of anesthesia    Pt has woken up during the procedure  . Hypertension   . Sleep apnea     Past Surgical History:  Procedure Laterality Date  . ACHILLES TENDON REPAIR Bilateral 2008  . LAPAROSCOPIC GASTRIC SLEEVE RESECTION N/A 09/15/2017   Procedure: LAPAROSCOPIC GASTRIC SLEEVE RESECTION WITH UPPER ENDO AND ERAS PATHWAY;  Surgeon: Johnathan Hausen, MD;  Location: [facility];  Service: General;  Laterality: N/A;  . PATELLAR TENDON REPAIR Bilateral   . right and left knee repair      Allergies  Allergen Reactions  . Lisinopril Cough  . Losartan     Pt has lots of swelling if he does not have diuretic with this medication     No family history on file.   Social History   Socioeconomic History  . Marital status: Married    Spouse name: Not on file  . Number of children: Not on file  . Years of education: Not on file  . Highest education level: Not on file  Occupational History  . Not on file  Social Needs  . Financial resource strain: Not on file  . Food insecurity    Worry: Never true    Inability: Never true  . Transportation needs    Medical: Not on file    Non-medical: Not on file  Tobacco Use  . Smoking status: Former Smoker    Years: 2.00    Quit date: 08/24/2017    Years since quitting: 1.4  . Smokeless tobacco: Never Used  . Tobacco comment: Social smoker  Substance and Sexual Activity  . Alcohol use: Yes    Comment: occass  . Drug use: Never  . Sexual activity: Yes  Lifestyle  . Physical activity    Days per week: Not on file    Minutes per session: Not on  file  . Stress: Not on file  Relationships  . Social Herbalist on phone: Not on file    Gets together: Not on file    Attends religious service: Not on file    Active member of club or organization: Not on file    Attends meetings of clubs or organizations: Not on file    Relationship status: Not on file  . Intimate partner violence    Fear of current or ex partner: Not on file    Emotionally abused: Not on file    Physically abused: Not on file    Forced sexual activity: Not on file  Other Topics Concern  . Not on file  Social History Narrative  . Not on file     PHYSICAL EXAM:  VS: There were no vitals taken for this visit. Physical Exam Gen: NAD, alert, cooperative with exam, well-appearing ENT: normal lips, normal nasal mucosa,  Eye: normal EOM, normal conjunctiva and lids CV:  no edema, +2 pedal pulses   Resp: no accessory muscle use, non-labored,  GI: no masses or tenderness, no hernia  Skin: no rashes, no areas of induration  Neuro: normal tone,  normal sensation to touch Psych:  normal insight, alert and oriented MSK:  ***      ASSESSMENT & PLAN:   No problem-specific Assessment & Plan notes found for this encounter.

## 2019-03-30 ENCOUNTER — Encounter (HOSPITAL_BASED_OUTPATIENT_CLINIC_OR_DEPARTMENT_OTHER): Payer: Self-pay | Admitting: *Deleted

## 2019-03-30 ENCOUNTER — Emergency Department (HOSPITAL_BASED_OUTPATIENT_CLINIC_OR_DEPARTMENT_OTHER)
Admission: EM | Admit: 2019-03-30 | Discharge: 2019-03-30 | Disposition: A | Discharge status: Home / Self Care | Payer: 59 | Attending: Emergency Medicine | Admitting: Emergency Medicine

## 2019-03-30 ENCOUNTER — Other Ambulatory Visit: Payer: Self-pay

## 2019-03-30 DIAGNOSIS — N4889 Other specified disorders of penis: Secondary | ICD-10-CM | POA: Diagnosis present

## 2019-03-30 DIAGNOSIS — I10 Essential (primary) hypertension: Secondary | ICD-10-CM | POA: Diagnosis not present

## 2019-03-30 DIAGNOSIS — Z87891 Personal history of nicotine dependence: Secondary | ICD-10-CM | POA: Diagnosis not present

## 2019-03-30 DIAGNOSIS — Z79899 Other long term (current) drug therapy: Secondary | ICD-10-CM | POA: Diagnosis not present

## 2019-03-30 LAB — URINALYSIS, ROUTINE W REFLEX MICROSCOPIC
Bilirubin Urine: NEGATIVE
Glucose, UA: NEGATIVE mg/dL
Hgb urine dipstick: NEGATIVE
Ketones, ur: NEGATIVE mg/dL
Leukocytes,Ua: NEGATIVE
Nitrite: NEGATIVE
Protein, ur: NEGATIVE mg/dL
Specific Gravity, Urine: 1.025 (ref 1.005–1.030)
pH: 7 (ref 5.0–8.0)

## 2019-03-30 MED ORDER — HYDROCORTISONE 1 % EX CREA: TOPICAL_CREAM | CUTANEOUS | 0 refills | Status: DC

## 2019-03-30 NOTE — ED Triage Notes (Signed)
C/o penis " discomfort" x 3 days, denies discharge  Or rash or dysuria

## 2019-03-30 NOTE — Discharge Instructions (Signed)
Please read and follow all provided instructions.  Your diagnoses today include:  1. Painful penis     Tests performed today include:  Vital signs. See below for your results today.   Urine test - no signs of infection in the urine  Medications prescribed:   Hydrocortisone cream - topical steroid medication for skin reaction  Home care instructions:  Follow any educational materials contained in this packet.  Follow-up instructions: Please follow-up with your primary care provider as needed for further evaluation of your symptoms.  Return instructions:   Please return to the Emergency Department if you experience worsening symptoms.   Return with worsening pain, redness, swelling of the penis or if you have pain or difficulty with urination.  Please return if you have any other emergent concerns.  Additional Information:  Your vital signs today were: BP (!) 180/100    Pulse 74    Temp 98.8 F (37.1 C) (Oral)    Resp 18    Ht 6\' 1"  (1.854 m)    Wt 108 kg    SpO2 100%    BMI 31.40 kg/m  If your blood pressure (BP) was elevated above 135/85 this visit, please have this repeated by your doctor within one month. ---------------

## 2019-03-30 NOTE — ED Provider Notes (Signed)
[facility] EMERGENCY DEPARTMENT Provider Note   CSN: [id number] Arrival date & time: 03/30/19  1514     History   Chief Complaint Chief Complaint  Patient presents with  . Penis Pain    HPI Javier Joyce is a 48 y.o. male.     Patient presents with approximately 3 days of discomfort in his penis.  He denies other symptoms.  Patient describes a discomfort which is like a sensitivity or a heat sensation in the shaft of his penis.  He does not have any testicular pain or discomfort.  He denies any penile discharge.  No dysuria, increased frequency or urgency.  Patient feels that the sensation is worse when he is touching the area.  Patient denies any specific new skin contacts.  No new soaps or detergents as far as he can tell.  No other skin changes.  Patient is sexually active with his wife only and does not really have a concern for sexual transmitted infections.  No fevers, abdominal pain or other symptoms.  No treatments prior to arrival.     Past Medical History:  Diagnosis Date  . Complication of anesthesia    Pt has woken up during the procedure  . Hypertension   . Sleep apnea     Patient Active Problem List   Diagnosis Date Noted  . MDD (major depressive disorder), single episode 12/20/2018  . Well adult exam 06/14/2018  . Bilateral primary osteoarthritis of knee 06/10/2018  . Thyromegaly 10/30/2017  . 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 Morbid obesity ([place]) 07/21/2017  . Essential hypertension 07/21/2017  . Bilateral knee pain 07/21/2017  . Erectile dysfunction 07/21/2017  . CTS (carpal tunnel syndrome) 01/10/2013    Past Surgical History:  Procedure Laterality Date  . ACHILLES TENDON REPAIR Bilateral 2008  . LAPAROSCOPIC GASTRIC SLEEVE RESECTION N/A 09/15/2017   Procedure: LAPAROSCOPIC GASTRIC SLEEVE RESECTION WITH UPPER ENDO AND ERAS PATHWAY;  Surgeon: Johnathan Hausen, MD;  Location: [facility];  Service: General;  Laterality:  N/A;  . PATELLAR TENDON REPAIR Bilateral   . right and left knee repair          Home Medications    Prior to Admission medications   Medication Sig Start Date End Date Taking? Authorizing Provider  Diclofenac Sodium (PENNSAID) 2 % SOLN Place 1 application onto the skin 2 (two) times daily. 12/29/18   Rosemarie Ax, MD  diltiazem [facility]) 120 MG 24 hr capsule  10/29/18   [provider]  escitalopram (LEXAPRO) 10 MG tablet Start 1/2 tab (5mg ) for first week then increase to whole tab (10mg ) 12/20/18   Luetta Nutting, DO  hydrocortisone cream 1 % Apply to affected area 2 times daily 03/30/19   Carlisle Cater, PA-C  ketotifen (ZADITOR) 0.025 % ophthalmic solution Place 1 drop into both eyes every 4 (four) hours as needed (itchy eyes).    [provider]  NASCOBAL 500 MCG/0.1ML SOLN  11/12/18   [provider]  olmesartan-hydrochlorothiazide (BENICAR HCT) 20-12.5 MG tablet Take 1 tablet by mouth daily. 12/20/18   Luetta Nutting, DO  sildenafil (VIAGRA) 100 MG tablet Take 1 tablet (100 mg total) by mouth daily as needed for erectile dysfunction. 06/10/18   Luetta Nutting, DO    Family History History reviewed. No pertinent family history.  Social History Social History   Tobacco Use  . Smoking status: Former Smoker    Years: 2.00    Quit date: 08/24/2017    Years  since quitting: 1.5  . Smokeless tobacco: Never Used  . Tobacco comment: Social smoker  Substance Use Topics  . Alcohol use: Yes    Comment: occass  . Drug use: Never     Allergies   Lisinopril and Losartan   Review of Systems Review of Systems  Constitutional: Negative for fever.  Genitourinary: Positive for penile pain. Negative for difficulty urinating, discharge, dysuria, flank pain, frequency, genital sores, hematuria, penile swelling, scrotal swelling, testicular pain and urgency.  Skin: Negative for color change.     Physical Exam Updated Vital Signs BP (!) 180/100   Pulse 74    Temp 98.8 F (37.1 C) (Oral)   Resp 18   Ht 6\' 1"  (1.854 m)   Wt 108 kg   SpO2 100%   BMI 31.40 kg/m   Physical Exam Vitals signs and nursing note reviewed.  Constitutional:      Appearance: He is well-developed.  HENT:     Head: Normocephalic and atraumatic.  Eyes:     Conjunctiva/sclera: Conjunctivae normal.  Neck:     Musculoskeletal: Normal range of motion and neck supple.  Pulmonary:     Effort: No respiratory distress.  Genitourinary:    Pubic Area: No rash.      Penis: Normal and circumcised. No erythema, tenderness, discharge, swelling or lesions.      Scrotum/Testes: Normal.        Right: Tenderness not present.        Left: Tenderness not present.  Lymphadenopathy:     Lower Body: No right inguinal adenopathy. No left inguinal adenopathy.  Skin:    General: Skin is warm and dry.  Neurological:     Mental Status: He is alert.      ED Treatments / Results  Labs (all labs ordered are listed, but only abnormal results are displayed) Labs Reviewed  URINALYSIS, ROUTINE W REFLEX MICROSCOPIC - Abnormal; Notable for the following components:      Result Value   APPearance CLOUDY (*)    All other components within normal limits  GC/CHLAMYDIA PROBE AMP () NOT AT [facility]    EKG None  Radiology No results found.  Procedures Procedures (including critical care time)  Medications Ordered in ED Medications - No data to display   Initial Impression / Assessment and Plan / ED Course  I have reviewed the triage vital signs and the nursing notes.  Pertinent labs & imaging results that were available during my care of the patient were reviewed by me and considered in my medical decision making (see chart for details).        Patient seen and examined.  His symptoms are mild.  No significant signs of cellulitis or skin infection.  I suspect that he likely has a mild contact dermatitis which is causing his sensitivity.  He does not give a good  history for urethritis, prostatitis, epididymitis or orchitis.  Urine without signs of infection.  GC chlamydia test sent with patient permission.  Will empirically treat with a low potency steroid cream.  Patient instructed not to use this for more than 1 week.  Vital signs reviewed and are as follows: BP (!) 180/100   Pulse 74   Temp 98.8 F (37.1 C) (Oral)   Resp 18   Ht 6\' 1"  (1.854 m)   Wt 108 kg   SpO2 100%   BMI 31.40 kg/m     Final Clinical Impressions(s) / ED Diagnoses   Final diagnoses:  Painful penis  Patient with penile sensitivity but essentially a normal exam.  This does not seem like a urethritis and urine is clear.  STI testing is pending.  No signs of cellulitis.  Steroid cream given for mild possible dermatitis.  Patient to be mindful of contact exposures and follow-up with his doctor as needed.  ED Discharge Orders         Ordered    hydrocortisone cream 1 %     03/30/19 1644           Carlisle Cater, PA-C 03/30/19 1724    Lajean Saver, MD 03/30/19 [phone]

## 2019-04-01 LAB — GC/CHLAMYDIA PROBE AMP (~~LOC~~) NOT AT ARMC
Chlamydia: NEGATIVE
Neisseria Gonorrhea: NEGATIVE

## 2019-04-06 ENCOUNTER — Encounter (HOSPITAL_COMMUNITY): Payer: Self-pay

## 2019-07-27 ENCOUNTER — Other Ambulatory Visit: Payer: Self-pay | Admitting: Family Medicine

## 2019-07-28 NOTE — Telephone Encounter (Signed)
Pt called back and said he is going to follow Dr Zigmund Daniel, He asked me to transfer him so I did to [phone]

## 2019-07-28 NOTE — Telephone Encounter (Signed)
Called patient to see if he was going to stay here at [facility] location or if he was going to transfer and follow Dr. Zigmund Daniel. Patient needing follow up on medications. No answer unable to leave message will call back.

## 2019-07-28 NOTE — Telephone Encounter (Signed)
Thanks [employer]

## 2019-08-29 ENCOUNTER — Encounter: Payer: Self-pay | Admitting: Family Medicine

## 2019-08-29 ENCOUNTER — Ambulatory Visit (INDEPENDENT_AMBULATORY_CARE_PROVIDER_SITE_OTHER): Payer: 59 | Admitting: Family Medicine

## 2019-08-29 VITALS — BP 172/100 | HR 75 | Ht 71.65 in | Wt 239.2 lb

## 2019-08-29 DIAGNOSIS — Z0000 Encounter for general adult medical examination without abnormal findings: Secondary | ICD-10-CM

## 2019-08-29 DIAGNOSIS — I10 Essential (primary) hypertension: Secondary | ICD-10-CM | POA: Diagnosis not present

## 2019-08-29 DIAGNOSIS — E010 Iodine-deficiency related diffuse (endemic) goiter: Secondary | ICD-10-CM | POA: Diagnosis not present

## 2019-08-29 DIAGNOSIS — M25561 Pain in right knee: Secondary | ICD-10-CM

## 2019-08-29 DIAGNOSIS — F320 Major depressive disorder, single episode, mild: Secondary | ICD-10-CM

## 2019-08-29 DIAGNOSIS — Z125 Encounter for screening for malignant neoplasm of prostate: Secondary | ICD-10-CM

## 2019-08-29 DIAGNOSIS — M25562 Pain in left knee: Secondary | ICD-10-CM

## 2019-08-29 DIAGNOSIS — Z1211 Encounter for screening for malignant neoplasm of colon: Secondary | ICD-10-CM

## 2019-08-29 DIAGNOSIS — Z13220 Encounter for screening for lipoid disorders: Secondary | ICD-10-CM

## 2019-08-29 DIAGNOSIS — G8929 Other chronic pain: Secondary | ICD-10-CM

## 2019-08-29 MED ORDER — SILDENAFIL CITRATE 100 MG PO TABS: 100.0000 mg | ORAL_TABLET | Freq: Every day | ORAL | 5 refills | Status: DC | PRN

## 2019-08-29 MED ORDER — KETOTIFEN FUMARATE 0.025 % OP SOLN: 1.0000 [drp] | OPHTHALMIC | 3 refills | Status: AC | PRN

## 2019-08-29 MED ORDER — BUPROPION HCL ER (XL) 150 MG PO TB24: 150.0000 mg | ORAL_TABLET | Freq: Every day | ORAL | 1 refills | Status: DC

## 2019-08-29 MED ORDER — OLMESARTAN MEDOXOMIL-HCTZ 20-12.5 MG PO TABS: 1.0000 | ORAL_TABLET | Freq: Every day | ORAL | 2 refills | Status: DC

## 2019-08-29 NOTE — Assessment & Plan Note (Signed)
Recommend scheduling with Dr. Darene Lamer.  He will also try voltaren gel to knees.

## 2019-08-29 NOTE — Patient Instructions (Signed)
Great to see you today! Restart medications.  Bupropion will replace escitalopram for depression and anxiety.   Have labs completed, we'll be in touch with results.  I have ordered referral to GI as well for your colonoscopy.

## 2019-08-29 NOTE — Assessment & Plan Note (Signed)
Well adult Orders Placed This Encounter  Procedures  . COMPLETE METABOLIC PANEL WITH GFR  . CBC  . Lipid Profile  . TSH  . PSA  . Ambulatory referral to Gastroenterology    Referral Priority:   Routine    Referral Type:   Consultation    Referral Reason:   Specialty Services Required    Number of Visits Requested:   1  Screening: Lipid, colonoscopy referral, PSA Immunizations: UTD.  Had COVID vaccine through [facility].  Anticipatory guidance/Risk Factor reduction: Recommendations per AVS.

## 2019-08-29 NOTE — Assessment & Plan Note (Signed)
Blood pressure is at goal at for age and co-morbidities.  I recommend he restart benicar-hct and diltiazem.  In addition they were instructed to follow a low sodium diet with regular exercise to help to maintain adequate control of blood pressure.

## 2019-08-29 NOTE — Progress Notes (Signed)
Javier Joyce - 49 y.o. male MRN [id number]  Date of birth: [birth year]  Subjective No chief complaint on file.   HPI Javier Joyce is a 49 y.o. male with history of HTN, OSA, depression with anxiety and history of weight loss surgery here today for annual exam.   He has been off all medication for a few months due to running out of refills.    Denies symptoms related to HTN.  He had done well on benicar-hct previously.   Lexapro helped previously with mood but made him feel emotionless.  He wants to try something different.  He is a non-smoker and consumes EtOH on rare occasion.    He continues to have issues with knees.   Review of Systems  Constitutional: Negative for chills, fever, malaise/fatigue and weight loss.  HENT: Negative for congestion, ear pain and sore throat.   Eyes: Negative for blurred vision, double vision and pain.  Respiratory: Negative for cough and shortness of breath.   Cardiovascular: Negative for chest pain and palpitations.  Gastrointestinal: Negative for abdominal pain, blood in stool, constipation, heartburn and nausea.  Genitourinary: Negative for dysuria and urgency.  Musculoskeletal: Positive for joint pain. Negative for myalgias.  Neurological: Negative for dizziness and headaches.  Endo/Heme/Allergies: Does not bruise/bleed easily.  Psychiatric/Behavioral: Positive for depression. The patient is not nervous/anxious and does not have insomnia.       Allergies  Allergen Reactions  . Lisinopril Cough  . Losartan     Pt has lots of swelling if he does not have diuretic with this medication     Past Medical History:  Diagnosis Date  . Complication of anesthesia    Pt has woken up during the procedure  . Hypertension   . Sleep apnea     Past Surgical History:  Procedure Laterality Date  . ACHILLES TENDON REPAIR Bilateral 2008  . LAPAROSCOPIC GASTRIC SLEEVE RESECTION N/A 09/15/2017   Procedure: LAPAROSCOPIC GASTRIC SLEEVE RESECTION WITH  UPPER ENDO AND ERAS PATHWAY;  Surgeon: Johnathan Hausen, MD;  Location: [facility];  Service: General;  Laterality: N/A;  . PATELLAR TENDON REPAIR Bilateral   . right and left knee repair      Social History   Socioeconomic History  . Marital status: Married    Spouse name: Not on file  . Number of children: Not on file  . Years of education: Not on file  . Highest education level: Not on file  Occupational History  . Not on file  Tobacco Use  . Smoking status: Former Smoker    Years: 2.00    Quit date: 08/24/2017    Years since quitting: 2.0  . Smokeless tobacco: Never Used  . Tobacco comment: Social smoker  Substance and Sexual Activity  . Alcohol use: Yes    Comment: occass  . Drug use: Never  . Sexual activity: Yes  Other Topics Concern  . Not on file  Social History Narrative  . Not on file   Social Determinants of Health   Financial Resource Strain:   . Difficulty of Paying Living Expenses:   Food Insecurity:   . Worried About Charity fundraiser in the Last Year:   . Arboriculturist in the Last Year:   Transportation Needs:   . Film/video editor (Medical):   Marland Kitchen Lack of Transportation (Non-Medical):   Physical Activity:   . Days of Exercise per Week:   . Minutes of Exercise per Session:   Stress:   .  Feeling of Stress :   Social Connections:   . Frequency of Communication with Friends and Family:   . Frequency of Social Gatherings with Friends and Family:   . Attends Religious Services:   . Active Member of Clubs or Organizations:   . Attends Archivist Meetings:   Marland Kitchen Marital Status:     No family history on file.  Health Maintenance  Topic Date Due  . COVID-19 Vaccine (1) Never done  . INFLUENZA VACCINE  11/20/2019  . TETANUS/TDAP  04/21/2026  . HIV Screening  Completed      ----------------------------------------------------------------------------------------------------------------------------------------------------------------------------------------------------------------- Physical Exam BP (!) 172/100 (BP Location: Left Arm, Patient Position: Sitting, Cuff Size: Large)   Pulse 75   Ht 5' 11.65" (1.82 m)   Wt 239 lb 3.2 oz (108.5 kg)   SpO2 100%   BMI 32.76 kg/m   Physical Exam Constitutional:      General: He is not in acute distress. HENT:     Head: Normocephalic and atraumatic.     Right Ear: Tympanic membrane and external ear normal.     Left Ear: External ear normal.     Mouth/Throat:     Mouth: Mucous membranes are moist.  Eyes:     General: No scleral icterus. Neck:     Thyroid: No thyromegaly.  Cardiovascular:     Rate and Rhythm: Normal rate and regular rhythm.     Heart sounds: Normal heart sounds.  Pulmonary:     Effort: Pulmonary effort is normal.     Breath sounds: Normal breath sounds.  Abdominal:     General: Bowel sounds are normal. There is no distension.     Palpations: Abdomen is soft.     Tenderness: There is no abdominal tenderness. There is no guarding.  Musculoskeletal:     Cervical back: Normal range of motion.  Lymphadenopathy:     Cervical: No cervical adenopathy.  Skin:    General: Skin is warm and dry.     Findings: No rash.  Neurological:     Mental Status: He is alert and oriented to person, place, and time.     Cranial Nerves: No cranial nerve deficit.     Motor: No abnormal muscle tone.  Psychiatric:        Mood and Affect: Mood normal.        Behavior: Behavior normal.     ------------------------------------------------------------------------------------------------------------------------------------------------------------------------------------------------------------------- Assessment and Plan  Essential hypertension Blood pressure is at goal at for age and co-morbidities.  I  recommend he restart benicar-hct and diltiazem.  In addition they were instructed to follow a low sodium diet with regular exercise to help to maintain adequate control of blood pressure.    Bilateral knee pain Recommend scheduling with Dr. Darene Lamer.  He will also try voltaren gel to knees.   MDD (major depressive disorder), single episode Not well controlled at this time. Will start bupropion in place of lexapro to try and lessen side effects he was having.    Well adult exam Well adult Orders Placed This Encounter  Procedures  . COMPLETE METABOLIC PANEL WITH GFR  . CBC  . Lipid Profile  . TSH  . PSA  . Ambulatory referral to Gastroenterology    Referral Priority:   Routine    Referral Type:   Consultation    Referral Reason:   Specialty Services Required    Number of Visits Requested:   1  Screening: Lipid, colonoscopy referral, PSA Immunizations: UTD.  Had COVID vaccine through  [facility].  Anticipatory guidance/Risk Factor reduction: Recommendations per AVS.    Meds ordered this encounter  Medications  . ketotifen (ZADITOR) 0.025 % ophthalmic solution    Sig: Place 1 drop into both eyes every 4 (four) hours as needed (itchy eyes).    Dispense:  5 mL    Refill:  3  . olmesartan-hydrochlorothiazide (BENICAR HCT) 20-12.5 MG tablet    Sig: Take 1 tablet by mouth daily.    Dispense:  90 tablet    Refill:  2  . buPROPion (WELLBUTRIN XL) 150 MG 24 hr tablet    Sig: Take 1 tablet (150 mg total) by mouth daily.    Dispense:  90 tablet    Refill:  1  . sildenafil (VIAGRA) 100 MG tablet    Sig: Take 1 tablet (100 mg total) by mouth daily as needed for erectile dysfunction.    Dispense:  30 tablet    Refill:  5    Return in about 6 weeks (around 10/10/2019) for HTN/Depression.    This visit occurred during the SARS-CoV-2 public health emergency.  Safety protocols were in place, including screening questions prior to the visit, additional usage of staff PPE, and extensive cleaning of  exam room while observing appropriate contact time as indicated for disinfecting solutions.

## 2019-08-29 NOTE — Assessment & Plan Note (Signed)
Not well controlled at this time. Will start bupropion in place of lexapro to try and lessen side effects he was having.

## 2019-09-26 ENCOUNTER — Encounter: Payer: Self-pay | Admitting: Gastroenterology

## 2019-10-04 ENCOUNTER — Other Ambulatory Visit: Payer: Self-pay

## 2019-10-04 ENCOUNTER — Ambulatory Visit (AMBULATORY_SURGERY_CENTER): Payer: Self-pay | Admitting: *Deleted

## 2019-10-04 VITALS — Ht 71.5 in | Wt 243.0 lb

## 2019-10-04 DIAGNOSIS — Z1211 Encounter for screening for malignant neoplasm of colon: Secondary | ICD-10-CM

## 2019-10-04 MED ORDER — SUTAB 1479-225-188 MG PO TABS: 24.0000 | ORAL_TABLET | ORAL | 0 refills | Status: DC

## 2019-10-04 NOTE — Progress Notes (Signed)
No egg or soy allergy known to patient  No issues with past sedation with any surgeries  or procedures, no intubation problems - has woken during knee surgery at [facility]  No diet pills per patient No home 02 use per patient  No blood thinners per patient  Pt denies issues with constipation  No A fib or A flutter  EMMI video sent to pt's e mail   Completed covid vaccines 07-2019   Due to the COVID-19 pandemic we are asking patients to follow these guidelines. Please only bring one care partner. Please be aware that your care partner may wait in the car in the parking lot or if they feel like they will be too hot to wait in the car, they may wait in the lobby on the 4th floor. All care partners are required to wear a mask the entire time (we do not have any that we can provide them), they need to practice social distancing, and we will do a Covid check for all patient's and care partners when you arrive. Also we will check their temperature and your temperature. If the care partner waits in their car they need to stay in the parking lot the entire time and we will call them on their cell phone when the patient is ready for discharge so they can bring the car to the front of the building. Also all patient's will need to wear a mask into building.

## 2019-10-14 ENCOUNTER — Ambulatory Visit: Payer: 59 | Admitting: Family Medicine

## 2019-10-14 ENCOUNTER — Ambulatory Visit (AMBULATORY_SURGERY_CENTER): Payer: 59 | Admitting: Gastroenterology

## 2019-10-14 ENCOUNTER — Encounter: Payer: Self-pay | Admitting: Gastroenterology

## 2019-10-14 ENCOUNTER — Other Ambulatory Visit: Payer: Self-pay

## 2019-10-14 VITALS — BP 123/82 | HR 64 | Temp 96.9°F | Resp 19 | Ht 71.5 in | Wt 243.0 lb

## 2019-10-14 DIAGNOSIS — D127 Benign neoplasm of rectosigmoid junction: Secondary | ICD-10-CM

## 2019-10-14 DIAGNOSIS — K635 Polyp of colon: Secondary | ICD-10-CM

## 2019-10-14 DIAGNOSIS — Z1211 Encounter for screening for malignant neoplasm of colon: Secondary | ICD-10-CM

## 2019-10-14 DIAGNOSIS — D125 Benign neoplasm of sigmoid colon: Secondary | ICD-10-CM

## 2019-10-14 DIAGNOSIS — D128 Benign neoplasm of rectum: Secondary | ICD-10-CM

## 2019-10-14 DIAGNOSIS — K621 Rectal polyp: Secondary | ICD-10-CM | POA: Diagnosis not present

## 2019-10-14 MED ORDER — SODIUM CHLORIDE 0.9 % IV SOLN
500.0000 mL | Freq: Once | INTRAVENOUS | Status: DC
Start: 2019-10-14 — End: 2019-10-14

## 2019-10-14 NOTE — Progress Notes (Signed)
VS- [place]  2nd dose of covid vaccine April 2021  Pt's states no medical or surgical changes since previsit or office visit.

## 2019-10-14 NOTE — Progress Notes (Signed)
To PACU, VSS. Report to [email or web address] 

## 2019-10-14 NOTE — Patient Instructions (Signed)
Handouts given for polyps, diverticulosis and hemorrhoids, also High Fiber diet.  Await pathology results.  YOU HAD AN ENDOSCOPIC PROCEDURE TODAY AT THE [facility]:   Refer to the procedure report that was given to you for any specific questions about what was found during the examination.  If the procedure report does not answer your questions, please call your gastroenterologist to clarify.  If you requested that your care partner not be given the details of your procedure findings, then the procedure report has been included in a sealed envelope for you to review at your convenience later.  YOU SHOULD EXPECT: Some feelings of bloating in the abdomen. Passage of more gas than usual.  Walking can help get rid of the air that was put into your GI tract during the procedure and reduce the bloating. If you had a lower endoscopy (such as a colonoscopy or flexible sigmoidoscopy) you may notice spotting of blood in your stool or on the toilet paper. If you underwent a bowel prep for your procedure, you may not have a normal bowel movement for a few days.  Please Note:  You might notice some irritation and congestion in your nose or some drainage.  This is from the oxygen used during your procedure.  There is no need for concern and it should clear up in a day or so.  SYMPTOMS TO REPORT IMMEDIATELY:   Following lower endoscopy (colonoscopy or flexible sigmoidoscopy):  Excessive amounts of blood in the stool  Significant tenderness or worsening of abdominal pains  Swelling of the abdomen that is new, acute  Fever of 100F or higher  For urgent or emergent issues, a gastroenterologist can be reached at any hour by calling [phone]. Do not use MyChart messaging for urgent concerns.    DIET:  We do recommend a small meal at first, but then you may proceed to your regular diet.  Drink plenty of fluids but you should avoid alcoholic beverages for 24 hours.  ACTIVITY:  You should plan  to take it easy for the rest of today and you should NOT DRIVE or use heavy machinery until tomorrow (because of the sedation medicines used during the test).    FOLLOW UP: Our staff will call the number listed on your records 48-72 hours following your procedure to check on you and address any questions or concerns that you may have regarding the information given to you following your procedure. If we do not reach you, we will leave a message.  We will attempt to reach you two times.  During this call, we will ask if you have developed any symptoms of COVID 19. If you develop any symptoms (ie: fever, flu-like symptoms, shortness of breath, cough etc.) before then, please call [phone].  If you test positive for Covid 19 in the 2 weeks post procedure, please call and report this information to us.    If any biopsies were taken you will be contacted by phone or by letter within the next 1-3 weeks.  Please call us at [phone] if you have not heard about the biopsies in 3 weeks.    SIGNATURES/CONFIDENTIALITY: You and/or your care partner have signed paperwork which will be entered into your electronic medical record.  These signatures attest to the fact that that the information above on your After Visit Summary has been reviewed and is understood.  Full responsibility of the confidentiality of this discharge information lies with you and/or your care-partner. 

## 2019-10-14 NOTE — Op Note (Signed)
[facility] Patient Name: Javier Joyce Procedure Date: 10/14/2019 8:56 AM MRN: [id number] Endoscopist: Ladene Artist , MD Age: 49 Referring MD:  Date of Birth: [birth year] Gender: Male Account #: [id number] Procedure:                Colonoscopy Indications:              Screening for colorectal malignant neoplasm Medicines:                Monitored Anesthesia Care Procedure:                Pre-Anesthesia Assessment:                           - Prior to the procedure, a History and Physical                            was performed, and patient medications and                            allergies were reviewed. The patient's tolerance of                            previous anesthesia was also reviewed. The risks                            and benefits of the procedure and the sedation                            options and risks were discussed with the patient.                            All questions were answered, and informed consent                            was obtained. Prior Anticoagulants: The patient has                            taken no previous anticoagulant or antiplatelet                            agents. ASA Grade Assessment: II - A patient with                            mild systemic disease. After reviewing the risks                            and benefits, the patient was deemed in                            satisfactory condition to undergo the procedure.                           After obtaining informed consent, the colonoscope  was passed under direct vision. Throughout the                            procedure, the patient's blood pressure, pulse, and                            oxygen saturations were monitored continuously. The                            Colonoscope was introduced through the anus and                            advanced to the the cecum, identified by                            appendiceal orifice and  ileocecal valve. The                            ileocecal valve, appendiceal orifice, and rectum                            were photographed. The quality of the bowel                            preparation was good. The colonoscopy was performed                            without difficulty. The patient tolerated the                            procedure well. Scope In: 9:01:42 AM Scope Out: 9:19:39 AM Scope Withdrawal Time: 0 hours 14 minutes 35 seconds  Total Procedure Duration: 0 hours 17 minutes 57 seconds  Findings:                 The perianal and digital rectal examinations were                            normal.                           Three sessile polyps were found in the rectum (2)                            and sigmoid colon (1). The polyps were 6 to 7 mm in                            size. These polyps were removed with a cold snare.                            Resection and retrieval were complete.                           Internal hemorrhoids were found during  retroflexion. The hemorrhoids were medium-sized and                            Grade I (internal hemorrhoids that do not prolapse).                           A few medium-mouthed diverticula were found in the                            left colon. There was no evidence of diverticular                            bleeding.                           The exam was otherwise without abnormality on                            direct and retroflexion views. Complications:            No immediate complications. Estimated blood loss:                            None. Estimated Blood Loss:     Estimated blood loss: none. Impression:               - Three 6 to 7 mm polyps in the rectum and in the                            sigmoid colon, removed with a cold snare. Resected                            and retrieved.                           - Mild left colon diverticulosis.                           -  Internal hemorrhoids.                           - The examination was otherwise normal on direct                            and retroflexion views. Recommendation:           - Repeat colonoscopy after studies are complete for                            surveillance based on pathology results.                           - Patient has a contact number available for                            emergencies. The signs and symptoms of potential  delayed complications were discussed with the                            patient. Return to normal activities tomorrow.                            Written discharge instructions were provided to the                            patient.                           - High fiber diet.                           - Continue present medications.                           - Await pathology results. Ladene Artist, MD 10/14/2019 9:24:10 AM This report has been signed electronically.

## 2019-10-14 NOTE — Progress Notes (Signed)
Called to room to assist during endoscopic procedure.  Patient ID and intended procedure confirmed with present staff. Received instructions for my participation in the procedure from the performing physician.  

## 2019-10-18 ENCOUNTER — Telehealth: Payer: Self-pay | Admitting: *Deleted

## 2019-10-18 NOTE — Telephone Encounter (Signed)
  Follow up Call-  Call back number 10/14/2019  Post procedure Call Back phone  # [phone]  Permission to leave phone message Yes  Some recent data might be hidden     Patient questions:  Do you have a fever, pain , or abdominal swelling? No. Pain Score  0 *  Have you tolerated food without any problems? Yes.    Have you been able to return to your normal activities? Yes.    Do you have any questions about your discharge instructions: Diet   No. Medications  No. Follow up visit  No.  Do you have questions or concerns about your Care? No.  Actions: * If pain score is 4 or above: No action needed, pain <4.  1. Have you developed a fever since your procedure? no  2.   Have you had an respiratory symptoms (SOB or cough) since your procedure? no  3.   Have you tested positive for COVID 19 since your procedure no  4.   Have you had any family members/close contacts diagnosed with the COVID 19 since your procedure?  no   If yes to any of these questions please route to Joylene John, RN and Erenest Rasher, RN

## 2019-10-18 NOTE — Telephone Encounter (Signed)
No message left,no answer.

## 2019-10-18 NOTE — Telephone Encounter (Signed)
Pt returned call. Pt states he is doing fine.

## 2019-10-21 ENCOUNTER — Encounter: Payer: Self-pay | Admitting: Gastroenterology

## 2020-04-05 ENCOUNTER — Encounter (HOSPITAL_COMMUNITY): Payer: Self-pay

## 2020-05-22 ENCOUNTER — Other Ambulatory Visit: Payer: Self-pay | Admitting: Family Medicine

## 2020-07-19 ENCOUNTER — Encounter: Payer: Self-pay | Admitting: Family Medicine

## 2020-07-19 ENCOUNTER — Ambulatory Visit (INDEPENDENT_AMBULATORY_CARE_PROVIDER_SITE_OTHER): Payer: 59

## 2020-07-19 ENCOUNTER — Ambulatory Visit: Payer: 59 | Admitting: Family Medicine

## 2020-07-19 ENCOUNTER — Other Ambulatory Visit: Payer: Self-pay | Admitting: Family Medicine

## 2020-07-19 ENCOUNTER — Other Ambulatory Visit: Payer: Self-pay

## 2020-07-19 VITALS — Temp 98.7°F | Ht 71.5 in | Wt 253.2 lb

## 2020-07-19 DIAGNOSIS — I10 Essential (primary) hypertension: Secondary | ICD-10-CM | POA: Diagnosis not present

## 2020-07-19 DIAGNOSIS — E559 Vitamin D deficiency, unspecified: Secondary | ICD-10-CM

## 2020-07-19 DIAGNOSIS — F320 Major depressive disorder, single episode, mild: Secondary | ICD-10-CM

## 2020-07-19 DIAGNOSIS — R42 Dizziness and giddiness: Secondary | ICD-10-CM

## 2020-07-19 DIAGNOSIS — E010 Iodine-deficiency related diffuse (endemic) goiter: Secondary | ICD-10-CM | POA: Diagnosis not present

## 2020-07-19 DIAGNOSIS — R209 Unspecified disturbances of skin sensation: Secondary | ICD-10-CM | POA: Insufficient documentation

## 2020-07-19 DIAGNOSIS — Z9884 Bariatric surgery status: Secondary | ICD-10-CM

## 2020-07-19 DIAGNOSIS — R635 Abnormal weight gain: Secondary | ICD-10-CM

## 2020-07-19 DIAGNOSIS — R55 Syncope and collapse: Secondary | ICD-10-CM

## 2020-07-19 DIAGNOSIS — Z125 Encounter for screening for malignant neoplasm of prostate: Secondary | ICD-10-CM

## 2020-07-19 LAB — COMPLETE METABOLIC PANEL WITH GFR
AG Ratio: 1.3 (calc) (ref 1.0–2.5)
ALT: 16 U/L (ref 9–46)
AST: 22 U/L (ref 10–35)
Albumin: 4.8 g/dL (ref 3.6–5.1)
Alkaline phosphatase (APISO): 98 U/L (ref 35–144)
BUN: 22 mg/dL (ref 7–25)
CO2: 30 mmol/L (ref 20–32)
Calcium: 9.9 mg/dL (ref 8.6–10.3)
Chloride: 101 mmol/L (ref 98–110)
Creat: 1.33 mg/dL (ref 0.70–1.33)
GFR, Est African American: 72 mL/min/{1.73_m2} (ref >=60)
GFR, Est Non African American: 62 mL/min/{1.73_m2} (ref >=60)
Globulin: 3.6 g/dL (calc) (ref 1.9–3.7)
Glucose, Bld: 93 mg/dL (ref 65–99)
Potassium: 4.6 mmol/L (ref 3.5–5.3)
Sodium: 139 mmol/L (ref 135–146)
Total Bilirubin: 1 mg/dL (ref 0.2–1.2)
Total Protein: 8.4 g/dL — ABNORMAL HIGH (ref 6.1–8.1)

## 2020-07-19 LAB — CBC WITH DIFFERENTIAL/PLATELET
Absolute Monocytes: 494 cells/uL (ref 200–950)
Basophils Absolute: 18 cells/uL (ref 0–200)
Basophils Relative: 0.3 %
Eosinophils Absolute: 128 cells/uL (ref 15–500)
Eosinophils Relative: 2.1 %
HCT: 42.6 % (ref 38.5–50.0)
Hemoglobin: 14.8 g/dL (ref 13.2–17.1)
Lymphs Abs: 1879 cells/uL (ref 850–3900)
MCH: 31 pg (ref 27.0–33.0)
MCHC: 34.7 g/dL (ref 32.0–36.0)
MCV: 89.1 fL (ref 80.0–100.0)
MPV: 10.2 fL (ref 7.5–12.5)
Monocytes Relative: 8.1 %
Neutro Abs: 3581 cells/uL (ref 1500–7800)
Neutrophils Relative %: 58.7 %
Platelets: 230 10*3/uL (ref 140–400)
RBC: 4.78 10*6/uL (ref 4.20–5.80)
RDW: 13.7 % (ref 11.0–15.0)
Total Lymphocyte: 30.8 %
WBC: 6.1 10*3/uL (ref 3.8–10.8)

## 2020-07-19 LAB — TSH: TSH: 2.28 mIU/L (ref 0.40–4.50)

## 2020-07-19 LAB — VITAMIN B12: Vitamin B-12: 370 pg/mL (ref 200–1100)

## 2020-07-19 LAB — VITAMIN D 25 HYDROXY (VIT D DEFICIENCY, FRACTURES): Vit D, 25-Hydroxy: 12 ng/mL — ABNORMAL LOW (ref 30–100)

## 2020-07-19 LAB — PSA: PSA: 0.87 ng/mL (ref <=4.0)

## 2020-07-19 MED ORDER — BUPROPION HCL ER (XL) 300 MG PO TB24: 300.0000 mg | ORAL_TABLET | Freq: Every day | ORAL | 2 refills | Status: DC

## 2020-07-19 NOTE — Patient Instructions (Addendum)
Have labs and xray completed.  Decrease olmesartan-hctz to 1/2 tab daily.  Follow up with me in about 6-8 weeks.

## 2020-07-19 NOTE — Assessment & Plan Note (Signed)
He feels like he needs to increase bupropion, will increase to 300 mg.  He will follow-up with me regarding this in about 3 months.

## 2020-07-19 NOTE — Progress Notes (Signed)
Javier Joyce - 50 y.o. male MRN [id number]  Date of birth: Jun 21, 1970  Subjective Chief Complaint  Patient presents with  . Loss of Consciousness    HPI Javier Joyce is a 50 y.o. male here today with complaint of syncopal episode.  He reports that he had syncopal episode a few days ago.  He went to the restroom to have a bowel movement and the left he remembers is standing up afterwards.  He states that when he came to his wife standing over him asking him what happened.  He is unsure how long he was out.  His wife did not witness any seizure activity and he felt fine afterwards.  He has felt some intermittent dizziness on occasion since then.  He has not had any chest pain, palpitations, shortness of breath.  He has noted some intermittent numbness in his hands.  This is worse in the morning and sometimes during the middle the night.  He denies neck pain.  He has not noticed any weakness.  He has gained some weight back after initially losing several pounds after sleeve gastrectomy.  He is interested in trying weight loss medication if possible to try to get back on track with weight loss.  ROS:  A comprehensive ROS was completed and negative except as noted per HPI  Allergies  Allergen Reactions  . Lisinopril Cough  . Losartan Cough    Pt has lots of swelling if he does not have diuretic with this medication     Past Medical History:  Diagnosis Date  . Anxiety   . Arthritis    knees   . Complication of anesthesia    Pt has woken up during the procedure  . Depression   . Hypertension   . Sleep apnea    past hx     Past Surgical History:  Procedure Laterality Date  . ACHILLES TENDON REPAIR Bilateral 2008  . LAPAROSCOPIC GASTRIC SLEEVE RESECTION N/A 09/15/2017   Procedure: LAPAROSCOPIC GASTRIC SLEEVE RESECTION WITH UPPER ENDO AND ERAS PATHWAY;  Surgeon: Johnathan Hausen, MD;  Location: [facility];  Service: General;  Laterality: N/A;  . PATELLAR TENDON REPAIR Bilateral   .  right and left knee repair      Social History   Socioeconomic History  . Marital status: Married    Spouse name: Not on file  . Number of children: Not on file  . Years of education: Not on file  . Highest education level: Not on file  Occupational History  . Not on file  Tobacco Use  . Smoking status: Former Smoker    Years: 2.00    Quit date: 08/24/2017    Years since quitting: 2.9  . Smokeless tobacco: Never Used  . Tobacco comment: Social smoker  Vaping Use  . Vaping Use: Never used  Substance and Sexual Activity  . Alcohol use: Yes    Comment: occass  . Drug use: Never  . Sexual activity: Yes  Other Topics Concern  . Not on file  Social History Narrative  . Not on file   Social Determinants of Health   Financial Resource Strain: Not on file  Food Insecurity: Not on file  Transportation Needs: Not on file  Physical Activity: Not on file  Stress: Not on file  Social Connections: Not on file    Family History  Problem Relation Age of Onset  . Other Father        SB blockage, + colostomy, ? cancer   .  Colon cancer Neg Hx   . Colon polyps Neg Hx   . Esophageal cancer Neg Hx   . Rectal cancer Neg Hx   . Stomach cancer Neg Hx     Health Maintenance  Topic Date Due  . Hepatitis C Screening  Never done  . COVID-19 Vaccine (1) Never done  . INFLUENZA VACCINE  11/20/2019  . TETANUS/TDAP  09/23/2026  . COLONOSCOPY (Pts 45-26yrs Insurance coverage will need to be confirmed)  10/13/2029  . HIV Screening  Completed  . HPV VACCINES  Aged Out     ----------------------------------------------------------------------------------------------------------------------------------------------------------------------------------------------------------------- Physical Exam Temp 98.7 F (37.1 C) (Oral)   Ht 5' 11.5" (1.816 m)   Wt 253 lb 3.2 oz (114.9 kg)   SpO2 100%   BMI 34.82 kg/m   Physical Exam Constitutional:      Appearance: Normal appearance.  HENT:      Head: Normocephalic and atraumatic.  Eyes:     General: No scleral icterus. Neck:     Comments: No carotid bruit Cardiovascular:     Rate and Rhythm: Normal rate and regular rhythm.     Pulses: Normal pulses.     Heart sounds: Normal heart sounds.  Pulmonary:     Effort: Pulmonary effort is normal.     Breath sounds: Normal breath sounds.  Musculoskeletal:     Cervical back: Neck supple.  Neurological:     General: No focal deficit present.     Mental Status: He is alert and oriented to person, place, and time.     Cranial Nerves: No cranial nerve deficit.     Motor: No weakness.  Psychiatric:        Mood and Affect: Mood normal.        Behavior: Behavior normal.     ------------------------------------------------------------------------------------------------------------------------------------------------------------------------------------------------------------------- Assessment and Plan  Syncope Recent syncopal episode with some dizziness. His BP is on the lower end of normal and he may be having hypotension at home.  I will have her reduce his olmesartan/hydrochlorothiazide to one half tab. EKG with normal sinus rhythm.  Poor R wave progression however no other signs of ischemia at this time.  Checking metabolic panel, CBC, TSH.    Disturbance of skin sensation He is having numbness into the left arm at night.  History of sleeve gastrectomy.  Will check B12 levels.  Also obtain x-rays of cervical spine  MDD (major depressive disorder), single episode He feels like he needs to increase bupropion, will increase to 300 mg.  He will follow-up with me regarding this in about 3 months.   No orders of the defined types were placed in this encounter.   No follow-ups on file.    This visit occurred during the SARS-CoV-2 public health emergency.  Safety protocols were in place, including screening questions prior to the visit, additional usage of staff PPE, and  extensive cleaning of exam room while observing appropriate contact time as indicated for disinfecting solutions.

## 2020-07-19 NOTE — Assessment & Plan Note (Signed)
Recent syncopal episode with some dizziness. His BP is on the lower end of normal and he may be having hypotension at home.  I will have her reduce his olmesartan/hydrochlorothiazide to one half tab. EKG with normal sinus rhythm.  Poor R wave progression however no other signs of ischemia at this time.  Checking metabolic panel, CBC, TSH.

## 2020-07-19 NOTE — Assessment & Plan Note (Addendum)
He is having numbness into the left arm at night.  History of sleeve gastrectomy.  Will check B12 levels.  Also obtain x-rays of cervical spine

## 2020-07-20 ENCOUNTER — Encounter: Payer: Self-pay | Admitting: Family Medicine

## 2020-07-20 ENCOUNTER — Other Ambulatory Visit: Payer: Self-pay | Admitting: Family Medicine

## 2020-07-20 MED ORDER — PHENTERMINE HCL 37.5 MG PO CAPS: 37.5000 mg | ORAL_CAPSULE | ORAL | 2 refills | Status: DC

## 2020-07-20 MED ORDER — VITAMIN D (ERGOCALCIFEROL) 1.25 MG (50000 UNIT) PO CAPS: 50000.0000 [IU] | ORAL_CAPSULE | ORAL | 1 refills | Status: DC

## 2020-07-20 NOTE — Telephone Encounter (Signed)
Rx for phentermine sent in.  F/u with me in 6 weeks.

## 2020-09-06 ENCOUNTER — Ambulatory Visit: Payer: 59 | Admitting: Family Medicine

## 2020-09-06 ENCOUNTER — Encounter: Payer: Self-pay | Admitting: Family Medicine

## 2020-09-06 ENCOUNTER — Other Ambulatory Visit: Payer: Self-pay

## 2020-09-06 DIAGNOSIS — K5900 Constipation, unspecified: Secondary | ICD-10-CM

## 2020-09-06 DIAGNOSIS — I10 Essential (primary) hypertension: Secondary | ICD-10-CM | POA: Diagnosis not present

## 2020-09-06 DIAGNOSIS — R55 Syncope and collapse: Secondary | ICD-10-CM

## 2020-09-06 MED ORDER — NIFEDIPINE POWD: 1 refills | Status: DC

## 2020-09-06 NOTE — Assessment & Plan Note (Signed)
Possibly related to phentermine.  Recommend increased fiber/miralax with increased fluid intake.   He may have an anal fissure as well.  Will add compounded nifedipine ointment.   He will contact us if not improving.

## 2020-09-06 NOTE — Progress Notes (Signed)
Javier Joyce - 50 y.o. male MRN [id number]  Date of birth: 1971-03-18  Subjective Chief Complaint  Patient presents with  . Hypertension    HPI Javier Joyce is a 50 y.o. male here today for follow up of dizziness.  He had been experiencing syncope and continued pre-syncopal symptoms that was thought to related to BP medication strength being too high.  Since reducing this his dizzy feeling has completely resolved.  He denies palpitations, headache or chest pain.    He was stared on phentermine at previous appt as he was starting to gain some weight back after bariatric surgery.  He has had some constipation related to this and thinks he may have a small tear.  He has pain with Bm and small amount of blood when wiping.  Using A&D ointment without much improvement.    ROS:  A comprehensive ROS was completed and negative except as noted per HPI  Allergies  Allergen Reactions  . Lisinopril Cough  . Losartan Cough    Pt has lots of swelling if he does not have diuretic with this medication     Past Medical History:  Diagnosis Date  . Anxiety   . Arthritis    knees   . Complication of anesthesia    Pt has woken up during the procedure  . Depression   . Hypertension   . Sleep apnea    past hx     Past Surgical History:  Procedure Laterality Date  . ACHILLES TENDON REPAIR Bilateral 2008  . LAPAROSCOPIC GASTRIC SLEEVE RESECTION N/A 09/15/2017   Procedure: LAPAROSCOPIC GASTRIC SLEEVE RESECTION WITH UPPER ENDO AND ERAS PATHWAY;  Surgeon: Johnathan Hausen, MD;  Location: [facility];  Service: General;  Laterality: N/A;  . PATELLAR TENDON REPAIR Bilateral   . right and left knee repair      Social History   Socioeconomic History  . Marital status: Married    Spouse name: Not on file  . Number of children: Not on file  . Years of education: Not on file  . Highest education level: Not on file  Occupational History  . Not on file  Tobacco Use  . Smoking status: Former Smoker     Years: 2.00    Quit date: 08/24/2017    Years since quitting: 3.0  . Smokeless tobacco: Never Used  . Tobacco comment: Social smoker  Vaping Use  . Vaping Use: Never used  Substance and Sexual Activity  . Alcohol use: Yes    Comment: occass  . Drug use: Never  . Sexual activity: Yes  Other Topics Concern  . Not on file  Social History Narrative  . Not on file   Social Determinants of Health   Financial Resource Strain: Not on file  Food Insecurity: Not on file  Transportation Needs: Not on file  Physical Activity: Not on file  Stress: Not on file  Social Connections: Not on file    Family History  Problem Relation Age of Onset  . Other Father        SB blockage, + colostomy, ? cancer   . Colon cancer Neg Hx   . Colon polyps Neg Hx   . Esophageal cancer Neg Hx   . Rectal cancer Neg Hx   . Stomach cancer Neg Hx     Health Maintenance  Topic Date Due  . Hepatitis C Screening  Never done  . COVID-19 Vaccine (3 - Booster for Pfizer series) 12/09/2019  . INFLUENZA VACCINE  11/19/2020  . TETANUS/TDAP  09/23/2026  . COLONOSCOPY (Pts 45-49yrs Insurance coverage will need to be confirmed)  10/13/2029  . HIV Screening  Completed  . HPV VACCINES  Aged Out     ----------------------------------------------------------------------------------------------------------------------------------------------------------------------------------------------------------------- Physical Exam BP 132/83 (BP Location: Left Arm, Patient Position: Sitting, Cuff Size: Large)   Pulse 67   Temp 97.9 F (36.6 C)   Ht 5' 11.5" (1.816 m)   Wt 252 lb 4.8 oz (114.4 kg)   SpO2 100%   BMI 34.70 kg/m   Physical Exam Constitutional:      Appearance: Normal appearance.  Cardiovascular:     Rate and Rhythm: Normal rate and regular rhythm.  Pulmonary:     Effort: Pulmonary effort is normal.     Breath sounds: Normal breath sounds.  Musculoskeletal:     Cervical back: Neck supple.   Neurological:     General: No focal deficit present.     Mental Status: He is alert.  Psychiatric:        Mood and Affect: Mood normal.        Behavior: Behavior normal.     ------------------------------------------------------------------------------------------------------------------------------------------------------------------------------------------------------------------- Assessment and Plan  Constipation Possibly related to phentermine.  Recommend increased fiber/miralax with increased fluid intake.   He may have an anal fissure as well.  Will add compounded nifedipine ointment.   He will contact us if not improving.   Syncope Pre-syncopal symptoms have resolved with decrease in BP medication.    Essential hypertension Blood pressure is at goal at for age and co-morbidities.  I recommend continuation of current medications.  In addition they were instructed to follow a low sodium diet with regular exercise to help to maintain adequate control of blood pressure.     Meds ordered this encounter  Medications  . NIFEdipine POWD    Sig: Please compound into a 0.3% ointment or gel and apply to rectal area TID.  Dispense: 100g    Dispense:  100 g    Refill:  1    No follow-ups on file.    This visit occurred during the SARS-CoV-2 public health emergency.  Safety protocols were in place, including screening questions prior to the visit, additional usage of staff PPE, and extensive cleaning of exam room while observing appropriate contact time as indicated for disinfecting solutions.

## 2020-09-06 NOTE — Assessment & Plan Note (Signed)
Pre-syncopal symptoms have resolved with decrease in BP medication.

## 2020-09-06 NOTE — Patient Instructions (Signed)
Cream for rectal are sent to [facility].  Try adding fiber supplement or miralax daily and increase fluid intake for constipation.

## 2020-09-06 NOTE — Assessment & Plan Note (Signed)
Blood pressure is at goal at for age and co-morbidities.  I recommend continuation of current medications.  In addition they were instructed to follow a low sodium diet with regular exercise to help to maintain adequate control of blood pressure.  ? ?

## 2020-09-19 ENCOUNTER — Other Ambulatory Visit: Payer: Self-pay | Admitting: Family Medicine

## 2020-09-19 ENCOUNTER — Encounter: Payer: Self-pay | Admitting: Family Medicine

## 2020-09-20 MED ORDER — SILDENAFIL CITRATE 100 MG PO TABS: 100.0000 mg | ORAL_TABLET | Freq: Every day | ORAL | 3 refills | Status: DC | PRN

## 2020-09-28 ENCOUNTER — Other Ambulatory Visit: Payer: Self-pay

## 2020-09-28 ENCOUNTER — Ambulatory Visit (INDEPENDENT_AMBULATORY_CARE_PROVIDER_SITE_OTHER): Payer: 59 | Admitting: Family Medicine

## 2020-09-28 VITALS — BP 121/79 | HR 80 | Resp 20 | Ht 71.5 in | Wt 252.0 lb

## 2020-09-28 DIAGNOSIS — Z23 Encounter for immunization: Secondary | ICD-10-CM

## 2020-09-28 NOTE — Progress Notes (Signed)
Established Patient Office Visit  Subjective:  Patient ID: Javier Joyce, male    DOB: 08-Apr-1971  Age: 50 y.o. MRN: [id number]  CC:  Chief Complaint  Patient presents with   Immunizations    HPI BUNYAN BRIER presents for a shingles vaccination. Shingrix given in left deltoid, pt tolerated well with no apparent complications. Pt will return in 2-6 months for next shingles vaccine.  Past Medical History:  Diagnosis Date   Anxiety    Arthritis    knees    Complication of anesthesia    Pt has woken up during the procedure   Depression    Hypertension    Sleep apnea    past hx     Past Surgical History:  Procedure Laterality Date   ACHILLES TENDON REPAIR Bilateral 2008   LAPAROSCOPIC GASTRIC SLEEVE RESECTION N/A 09/15/2017   Procedure: LAPAROSCOPIC GASTRIC SLEEVE RESECTION WITH UPPER ENDO AND ERAS PATHWAY;  Surgeon: Johnathan Hausen, MD;  Location: [facility];  Service: General;  Laterality: N/A;   PATELLAR TENDON REPAIR Bilateral    right and left knee repair      Family History  Problem Relation Age of Onset   Other Father        SB blockage, + colostomy, ? cancer    Colon cancer Neg Hx    Colon polyps Neg Hx    Esophageal cancer Neg Hx    Rectal cancer Neg Hx    Stomach cancer Neg Hx     Social History   Socioeconomic History   Marital status: Married    Spouse name: Not on file   Number of children: Not on file   Years of education: Not on file   Highest education level: Not on file  Occupational History   Not on file  Tobacco Use   Smoking status: Former    Years: 2.00    Pack years: 0.00    Types: Cigarettes    Quit date: 08/24/2017    Years since quitting: 3.0   Smokeless tobacco: Never   Tobacco comments:    Social smoker  Vaping Use   Vaping Use: Never used  Substance and Sexual Activity   Alcohol use: Yes    Comment: occass   Drug use: Never   Sexual activity: Yes  Other Topics Concern   Not on file  Social History Narrative   Not on  file   Social Determinants of Health   Financial Resource Strain: Not on file  Food Insecurity: Not on file  Transportation Needs: Not on file  Physical Activity: Not on file  Stress: Not on file  Social Connections: Not on file  Intimate Partner Violence: Not on file    Outpatient Medications Prior to Visit  Medication Sig Dispense Refill   sildenafil (VIAGRA) 100 MG tablet Take 1 tablet (100 mg total) by mouth daily as needed for erectile dysfunction. 10 tablet 3   buPROPion (WELLBUTRIN XL) 300 MG 24 hr tablet Take 1 tablet (300 mg total) by mouth daily. 90 tablet 2   diltiazem (TIAZAC) 120 MG 24 hr capsule      ketotifen (ZADITOR) 0.025 % ophthalmic solution Place 1 drop into both eyes every 4 (four) hours as needed (itchy eyes). 5 mL 3   NIFEdipine POWD Please compound into a 0.3% ointment or gel and apply to rectal area TID.  Dispense: 100g 100 g 1   olmesartan-hydrochlorothiazide (BENICAR HCT) 20-12.5 MG tablet Take 1 tablet by mouth once daily 30  tablet 0   phentermine 37.5 MG capsule Take 1 capsule (37.5 mg total) by mouth every morning. 30 capsule 2   sildenafil (VIAGRA) 100 MG tablet TAKE 1 TABLET BY MOUTH ONCE DAILY AS NEEDED FOR ERECTILE DYSFUNCTION 5 tablet 0   No facility-administered medications prior to visit.    Allergies  Allergen Reactions   Lisinopril Cough   Losartan Cough    Pt has lots of swelling if he does not have diuretic with this medication     ROS Review of Systems    Objective:    Physical Exam  BP 121/79 (BP Location: Left Arm, Patient Position: Sitting, Cuff Size: Large)   Pulse 80   Resp 20   Ht 5' 11.5" (1.816 m)   Wt 252 lb (114.3 kg)   SpO2 100%   BMI 34.66 kg/m  Wt Readings from Last 3 Encounters:  09/28/20 252 lb (114.3 kg)  09/06/20 252 lb 4.8 oz (114.4 kg)  07/19/20 253 lb 3.2 oz (114.9 kg)     Health Maintenance Due  Topic Date Due   Pneumococcal Vaccine 76-51 Years old (1 - PCV) Never done   Hepatitis C Screening   Never done   Zoster Vaccines- Shingrix (1 of 2) Never done   COVID-19 Vaccine (3 - Booster for Pfizer series) 12/09/2019    There are no preventive care reminders to display for this patient.  Lab Results  Component Value Date   TSH 2.28 07/19/2020   Lab Results  Component Value Date   WBC 6.1 07/19/2020   HGB 14.8 07/19/2020   HCT 42.6 07/19/2020   MCV 89.1 07/19/2020   PLT 230 07/19/2020   Lab Results  Component Value Date   NA 139 07/19/2020   K 4.6 07/19/2020   CO2 30 07/19/2020   GLUCOSE 93 07/19/2020   BUN 22 07/19/2020   CREATININE 1.33 07/19/2020   BILITOT 1.0 07/19/2020   ALKPHOS 104 06/14/2018   AST 22 07/19/2020   ALT 16 07/19/2020   PROT 8.4 (H) 07/19/2020   ALBUMIN 4.4 06/14/2018   CALCIUM 9.9 07/19/2020   ANIONGAP 11 09/15/2017   GFR 81.86 06/14/2018   Lab Results  Component Value Date   CHOL 189 06/14/2018   Lab Results  Component Value Date   HDL 65.50 06/14/2018   Lab Results  Component Value Date   LDLCALC 111 (H) 06/14/2018   Lab Results  Component Value Date   TRIG 63.0 06/14/2018   Lab Results  Component Value Date   CHOLHDL 3 06/14/2018   No results found for: HGBA1C    Assessment & Plan:  Shingrix given in left deltoid, pt tolerated well with no apparent complications. Pt will return in 2-6 months for next shingles vaccine. Problem List Items Addressed This Visit   None Visit Diagnoses     Need for shingles vaccine    -  Primary   Relevant Orders   Varicella-zoster vaccine IM (Shingrix)       No orders of the defined types were placed in this encounter.   Follow-up: Return 2-6 months, for Shingles vaccine.    Ninfa Meeker, CMA

## 2020-09-28 NOTE — Progress Notes (Signed)
Medical screening examination/treatment was performed by qualified clinical staff member and as supervising physician I was immediately available for consultation/collaboration. I have reviewed documentation and agree with assessment and plan.  Kwali Wrinkle, DO  

## 2020-10-18 ENCOUNTER — Other Ambulatory Visit: Payer: Self-pay | Admitting: Family Medicine

## 2020-11-01 ENCOUNTER — Other Ambulatory Visit: Payer: Self-pay | Admitting: Family Medicine

## 2020-11-01 ENCOUNTER — Encounter: Payer: Self-pay | Admitting: Family Medicine

## 2020-11-01 MED ORDER — SILDENAFIL CITRATE 100 MG PO TABS: 100.0000 mg | ORAL_TABLET | Freq: Every day | ORAL | 0 refills | Status: DC | PRN

## 2020-11-16 ENCOUNTER — Other Ambulatory Visit: Payer: Self-pay

## 2020-11-16 ENCOUNTER — Encounter: Payer: Self-pay | Admitting: Family Medicine

## 2020-11-16 ENCOUNTER — Ambulatory Visit: Payer: 59 | Admitting: Family Medicine

## 2020-11-16 VITALS — BP 130/84 | HR 70 | Temp 97.9°F | Ht 71.5 in | Wt 249.0 lb

## 2020-11-16 DIAGNOSIS — K602 Anal fissure, unspecified: Secondary | ICD-10-CM | POA: Diagnosis not present

## 2020-11-16 MED ORDER — NITROGLYCERIN 0.4 % RE OINT: TOPICAL_OINTMENT | RECTAL | 1 refills | Status: DC

## 2020-11-16 MED ORDER — PHENTERMINE HCL 37.5 MG PO CAPS: 37.5000 mg | ORAL_CAPSULE | ORAL | 2 refills | Status: DC

## 2020-11-16 NOTE — Patient Instructions (Addendum)
Try nitroglycerin to replace nifedipine ointment.  This may cause headache.  Do not use sildenafil within 24 hours of using ointment.   I have entered a referral to surgeon.  Schedule with Dr. Dianah Field for evaluation of carpal tunnel.

## 2020-11-18 DIAGNOSIS — K602 Anal fissure, unspecified: Secondary | ICD-10-CM | POA: Insufficient documentation

## 2020-11-18 NOTE — Assessment & Plan Note (Signed)
He continues to have pain with exam consistent with anal fissure.  He has not had much improvement with nifedipine we will try switching to nitroglycerin to see if this provides any additional improvement.  We discussed avoiding sildenafil within 24 hours of using nitroglycerin.  Referral placed to general surgery as well to be sure he does not have a fistula which is preventing healing.

## 2020-11-18 NOTE — Assessment & Plan Note (Signed)
He has done well since sleeve gastrectomy however weight loss seems to have plateaued.  He has had some improvement since adding phentermine we will continue this for now.  He is tolerating well.

## 2020-11-18 NOTE — Progress Notes (Signed)
Javier Joyce - 50 y.o. male MRN [id number]  Date of birth: 09/26/1970  Subjective No chief complaint on file.   HPI Javier Joyce is a 50 year old male here today for follow-up of rectal pain.  I saw him about 4 weeks ago for same complaint and was prescribed nifedipine gel.  He reports that he does have some improvement with this however tends to have recurrence of pain after bowel movement.  He has not noticed any significant bleeding related to this.  He does have some issues with constipation intermittently for most part feels his bowels are moving normally.  He is using a stool softener.  ROS:  A comprehensive ROS was completed and negative except as noted per HPI  Allergies  Allergen Reactions   Lisinopril Cough   Losartan Cough    Pt has lots of swelling if he does not have diuretic with this medication     Past Medical History:  Diagnosis Date   Anxiety    Arthritis    knees    Complication of anesthesia    Pt has woken up during the procedure   Depression    Hypertension    Sleep apnea    past hx     Past Surgical History:  Procedure Laterality Date   ACHILLES TENDON REPAIR Bilateral 2008   LAPAROSCOPIC GASTRIC SLEEVE RESECTION N/A 09/15/2017   Procedure: LAPAROSCOPIC GASTRIC SLEEVE RESECTION WITH UPPER ENDO AND ERAS PATHWAY;  Surgeon: Johnathan Hausen, MD;  Location: [facility];  Service: General;  Laterality: N/A;   PATELLAR TENDON REPAIR Bilateral    right and left knee repair      Social History   Socioeconomic History   Marital status: Married    Spouse name: Not on file   Number of children: Not on file   Years of education: Not on file   Highest education level: Not on file  Occupational History   Not on file  Tobacco Use   Smoking status: Former    Years: 2.00    Types: Cigarettes    Quit date: 08/24/2017    Years since quitting: 3.2   Smokeless tobacco: Never   Tobacco comments:    Social smoker  Vaping Use   Vaping Use: Never used  Substance and  Sexual Activity   Alcohol use: Yes    Comment: occass   Drug use: Never   Sexual activity: Yes  Other Topics Concern   Not on file  Social History Narrative   Not on file   Social Determinants of Health   Financial Resource Strain: Not on file  Food Insecurity: Not on file  Transportation Needs: Not on file  Physical Activity: Not on file  Stress: Not on file  Social Connections: Not on file    Family History  Problem Relation Age of Onset   Other Father        SB blockage, + colostomy, ? cancer    Colon cancer Neg Hx    Colon polyps Neg Hx    Esophageal cancer Neg Hx    Rectal cancer Neg Hx    Stomach cancer Neg Hx     Health Maintenance  Topic Date Due   Pneumococcal Vaccine 29-61 Years old (1 - PCV) Never done   Hepatitis C Screening  Never done   COVID-19 Vaccine (3 - Booster for Pfizer series) 12/09/2019   INFLUENZA VACCINE  11/19/2020   Zoster Vaccines- Shingrix (2 of 2) 11/23/2020   TETANUS/TDAP  09/23/2026   COLONOSCOPY (  Pts 45-48yr Insurance coverage will need to be confirmed)  10/13/2029   HIV Screening  Completed   HPV VACCINES  Aged Out     ----------------------------------------------------------------------------------------------------------------------------------------------------------------------------------------------------------------- Physical Exam BP 130/84 (BP Location: Left Arm, Patient Position: Sitting, Cuff Size: Large)   Pulse 70   Temp 97.9 F (36.6 C)   Ht 5' 11.5" (1.816 m)   Wt 249 lb (112.9 kg)   SpO2 96%   BMI 34.24 kg/m   Physical Exam Constitutional:      Appearance: Normal appearance.  HENT:     Head: Normocephalic and atraumatic.  Cardiovascular:     Rate and Rhythm: Normal rate and regular rhythm.  Genitourinary:    Comments: There appears to continue to be a small fissure at approximately 12-12 30 along the anus.  There is tenderness along this area. Musculoskeletal:     Cervical back: Neck supple.   Neurological:     General: No focal deficit present.     Mental Status: He is alert.  Psychiatric:        Mood and Affect: Mood normal.        Behavior: Behavior normal.    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  He has not had much improvement with nifedipine we will try switching to nitroglycerin to see if this provides any additional improvement.  We discussed avoiding sildenafil within 24 hours of using nitroglycerin.  Referral placed to general surgery as well to be sure he does not have a fistula which is preventing healing.  Morbid obesity ([place] He has done well since sleeve gastrectomy however weight loss seems to have plateaued.  He has had some improvement since adding phentermine we will continue this for now.  He is tolerating well.   Meds ordered this encounter  Medications   phentermine 37.5 MG capsule    Sig: Take 1 capsule (37.5 mg total) by mouth every morning.    Dispense:  30 capsule    Refill:  2   Nitroglycerin 0.4 % OINT    Sig: Use twice daily for up to 8 weeks    Dispense:  30 g    Refill:  1    No follow-ups on file.    This visit occurred during the SARS-CoV-2 public health emergency.  Safety protocols were in place, including screening questions prior to the visit, additional usage of staff PPE, and extensive cleaning of exam room while observing appropriate contact time as indicated for disinfecting solutions.

## 2020-11-28 ENCOUNTER — Encounter: Payer: 59 | Admitting: Sports Medicine

## 2020-12-02 ENCOUNTER — Encounter: Payer: Self-pay | Admitting: Family Medicine

## 2021-01-18 ENCOUNTER — Encounter: Payer: Self-pay | Admitting: Family Medicine

## 2021-01-18 ENCOUNTER — Other Ambulatory Visit: Payer: Self-pay | Admitting: Family Medicine

## 2021-01-22 MED ORDER — BUPROPION HCL ER (XL) 300 MG PO TB24: 300.0000 mg | ORAL_TABLET | Freq: Every day | ORAL | 0 refills | Status: DC

## 2021-01-22 MED ORDER — OLMESARTAN MEDOXOMIL-HCTZ 20-12.5 MG PO TABS: 1.0000 | ORAL_TABLET | Freq: Every day | ORAL | 0 refills | Status: DC

## 2021-01-22 MED ORDER — SILDENAFIL CITRATE 100 MG PO TABS: 100.0000 mg | ORAL_TABLET | Freq: Every day | ORAL | 3 refills | Status: DC | PRN

## 2021-02-14 ENCOUNTER — Encounter: Payer: Self-pay | Admitting: Family Medicine

## 2021-02-14 ENCOUNTER — Ambulatory Visit (INDEPENDENT_AMBULATORY_CARE_PROVIDER_SITE_OTHER): Payer: 59 | Admitting: Family Medicine

## 2021-02-14 ENCOUNTER — Other Ambulatory Visit: Payer: Self-pay

## 2021-02-14 VITALS — BP 118/69 | HR 70 | Temp 98.4°F | Ht 71.5 in | Wt 245.2 lb

## 2021-02-14 DIAGNOSIS — Z0000 Encounter for general adult medical examination without abnormal findings: Secondary | ICD-10-CM | POA: Diagnosis not present

## 2021-02-14 DIAGNOSIS — Z13220 Encounter for screening for lipoid disorders: Secondary | ICD-10-CM | POA: Diagnosis not present

## 2021-02-14 DIAGNOSIS — Z23 Encounter for immunization: Secondary | ICD-10-CM | POA: Diagnosis not present

## 2021-02-14 DIAGNOSIS — I10 Essential (primary) hypertension: Secondary | ICD-10-CM | POA: Diagnosis not present

## 2021-02-14 DIAGNOSIS — R3912 Poor urinary stream: Secondary | ICD-10-CM

## 2021-02-14 MED ORDER — SILDENAFIL CITRATE 100 MG PO TABS: 100.0000 mg | ORAL_TABLET | Freq: Every day | ORAL | 3 refills | Status: DC | PRN

## 2021-02-14 MED ORDER — BUPROPION HCL ER (XL) 300 MG PO TB24: 300.0000 mg | ORAL_TABLET | Freq: Every day | ORAL | 3 refills | Status: DC

## 2021-02-14 MED ORDER — PHENTERMINE HCL 37.5 MG PO CAPS: 37.5000 mg | ORAL_CAPSULE | ORAL | 2 refills | Status: DC

## 2021-02-14 MED ORDER — OLMESARTAN MEDOXOMIL-HCTZ 20-12.5 MG PO TABS: 1.0000 | ORAL_TABLET | Freq: Every day | ORAL | 3 refills | Status: DC

## 2021-02-14 MED ORDER — TAMSULOSIN HCL 0.4 MG PO CAPS: 0.4000 mg | ORAL_CAPSULE | Freq: Every day | ORAL | 3 refills | Status: DC

## 2021-02-14 NOTE — Progress Notes (Signed)
Javier Joyce - 50 y.o. male MRN [id number]  Date of birth: [birth year]  Subjective Chief Complaint  Patient presents with   Annual Exam    HPI Javier Joyce is a 50 year old male here today for annual exam.  He has concerns about weak urinary stream as well as some mild frequency.  He has not noted any pain with urination or blood in his urine.  Other chronic conditions remain well controlled at this time.  He does try to stay fairly active and tries to follow a fairly healthy diet.  He is a former smoker, only smoked briefly.  He consumes alcohol occasionally.  He would like to have flu vaccine today.  Review of Systems  Constitutional:  Negative for chills, fever, malaise/fatigue and weight loss.  HENT:  Negative for congestion, ear pain and sore throat.   Eyes:  Negative for blurred vision, double vision and pain.  Respiratory:  Negative for cough and shortness of breath.   Cardiovascular:  Negative for chest pain and palpitations.  Gastrointestinal:  Negative for abdominal pain, blood in stool, constipation, heartburn and nausea.  Genitourinary:  Negative for dysuria and urgency.  Musculoskeletal:  Negative for joint pain and myalgias.  Neurological:  Negative for dizziness and headaches.  Endo/Heme/Allergies:  Does not bruise/bleed easily.  Psychiatric/Behavioral:  Negative for depression. The patient is not nervous/anxious and does not have insomnia.    Allergies  Allergen Reactions   Lisinopril Cough   Losartan Cough    Pt has lots of swelling if he does not have diuretic with this medication     Past Medical History:  Diagnosis Date   Anxiety    Arthritis    knees    Complication of anesthesia    Pt has woken up during the procedure   Depression    Hypertension    Sleep apnea    past hx     Past Surgical History:  Procedure Laterality Date   ACHILLES TENDON REPAIR Bilateral 2008   LAPAROSCOPIC GASTRIC SLEEVE RESECTION N/A 09/15/2017   Procedure: LAPAROSCOPIC  GASTRIC SLEEVE RESECTION WITH UPPER ENDO AND ERAS PATHWAY;  Surgeon: Johnathan Hausen, MD;  Location: [facility];  Service: General;  Laterality: N/A;   PATELLAR TENDON REPAIR Bilateral    right and left knee repair      Social History   Socioeconomic History   Marital status: Married    Spouse name: Not on file   Number of children: Not on file   Years of education: Not on file   Highest education level: Not on file  Occupational History   Not on file  Tobacco Use   Smoking status: Former    Years: 2.00    Types: Cigarettes    Quit date: 08/24/2017    Years since quitting: 3.4   Smokeless tobacco: Never   Tobacco comments:    Social smoker  Vaping Use   Vaping Use: Never used  Substance and Sexual Activity   Alcohol use: Yes    Comment: occass   Drug use: Never   Sexual activity: Yes  Other Topics Concern   Not on file  Social History Narrative   Not on file   Social Determinants of Health   Financial Resource Strain: Not on file  Food Insecurity: Not on file  Transportation Needs: Not on file  Physical Activity: Not on file  Stress: Not on file  Social Connections: Not on file    Family History  Problem Relation Age of Onset  Other Father        SB blockage, + colostomy, ? cancer    Colon cancer Neg Hx    Colon polyps Neg Hx    Esophageal cancer Neg Hx    Rectal cancer Neg Hx    Stomach cancer Neg Hx     Health Maintenance  Topic Date Due   Pneumococcal Vaccine 74-36 Years old (1 - PCV) Never done   Hepatitis C Screening  Never done   COVID-19 Vaccine (3 - Booster for Pfizer series) 09/03/2019   TETANUS/TDAP  09/23/2026   COLONOSCOPY (Pts 45-23yrs Insurance coverage will need to be confirmed)  10/13/2029   INFLUENZA VACCINE  Completed   HIV Screening  Completed   Zoster Vaccines- Shingrix  Completed   HPV VACCINES  Aged Out      ----------------------------------------------------------------------------------------------------------------------------------------------------------------------------------------------------------------- Physical Exam BP 118/69 (BP Location: Left Arm, Patient Position: Sitting, Cuff Size: Large)   Pulse 70   Temp 98.4 F (36.9 C)   Ht 5' 11.5" (1.816 m)   Wt 245 lb 3.2 oz (111.2 kg)   SpO2 100%   BMI 33.72 kg/m   Physical Exam Constitutional:      General: He is not in acute distress. HENT:     Head: Normocephalic and atraumatic.     Right Ear: Tympanic membrane and external ear normal.     Left Ear: Tympanic membrane and external ear normal.  Eyes:     General: No scleral icterus. Neck:     Thyroid: No thyromegaly.  Cardiovascular:     Rate and Rhythm: Normal rate and regular rhythm.     Heart sounds: Normal heart sounds.  Pulmonary:     Effort: Pulmonary effort is normal.     Breath sounds: Normal breath sounds.  Abdominal:     General: Bowel sounds are normal. There is no distension.     Palpations: Abdomen is soft.     Tenderness: There is no abdominal tenderness. There is no guarding.  Musculoskeletal:     Cervical back: Normal range of motion.  Lymphadenopathy:     Cervical: No cervical adenopathy.  Skin:    General: Skin is warm and dry.     Findings: No rash.  Neurological:     Mental Status: He is alert and oriented to person, place, and time.     Cranial Nerves: No cranial nerve deficit.     Motor: No abnormal muscle tone.  Psychiatric:        Mood and Affect: Mood normal.        Behavior: Behavior normal.    ------------------------------------------------------------------------------------------------------------------------------------------------------------------------------------------------------------------- Assessment and Plan  Well adult exam Well adult Orders Placed This Encounter  Procedures   Varicella-zoster vaccine IM  (Shingrix)   Flu Vaccine QUAD 6+ mos PF IM (Fluarix Quad PF)   COMPLETE METABOLIC PANEL WITH GFR   CBC with Differential   Lipid Panel w/reflex Direct LDL   PSA  Screening: PSA, lipid panel Immunizations: Flu vaccine Anticipatory guidance/risk factor reduction: Recommendations per AVS.   Meds ordered this encounter  Medications   tamsulosin (FLOMAX) 0.4 MG CAPS capsule    Sig: Take 1 capsule (0.4 mg total) by mouth daily.    Dispense:  30 capsule    Refill:  3   phentermine 37.5 MG capsule    Sig: Take 1 capsule (37.5 mg total) by mouth every morning.    Dispense:  30 capsule    Refill:  2   olmesartan-hydrochlorothiazide (BENICAR HCT) 20-12.5 MG tablet  Sig: Take 1 tablet by mouth daily.    Dispense:  90 tablet    Refill:  3   buPROPion (WELLBUTRIN XL) 300 MG 24 hr tablet    Sig: Take 1 tablet (300 mg total) by mouth daily.    Dispense:  90 tablet    Refill:  3   sildenafil (VIAGRA) 100 MG tablet    Sig: Take 1 tablet (100 mg total) by mouth daily as needed for erectile dysfunction.    Dispense:  20 tablet    Refill:  3    No follow-ups on file.    This visit occurred during the SARS-CoV-2 public health emergency.  Safety protocols were in place, including screening questions prior to the visit, additional usage of staff PPE, and extensive cleaning of exam room while observing appropriate contact time as indicated for disinfecting solutions.

## 2021-02-14 NOTE — Assessment & Plan Note (Signed)
Well adult Orders Placed This Encounter  Procedures  . Varicella-zoster vaccine IM (Shingrix)  . Flu Vaccine QUAD 6+ mos PF IM (Fluarix Quad PF)  . COMPLETE METABOLIC PANEL WITH GFR  . CBC with Differential  . Lipid Panel w/reflex Direct LDL  . PSA  Screening: PSA, lipid panel Immunizations: Flu vaccine Anticipatory guidance/risk factor reduction: Recommendations per AVS.

## 2021-02-14 NOTE — Patient Instructions (Signed)
Preventive Care 40-51 Years Old, Male Preventive care refers to lifestyle choices and visits with your health care provider that can promote health and wellness. This includes: A yearly physical exam. This is also called an annual wellness visit. Regular dental and eye exams. Immunizations. Screening for certain conditions. Healthy lifestyle choices, such as: Eating a healthy diet. Getting regular exercise. Not using drugs or products that contain nicotine and tobacco. Limiting alcohol use. What can I expect for my preventive care visit? Physical exam Your health care provider will check your: Height and weight. These may be used to calculate your BMI (body mass index). BMI is a measurement that tells if you are at a healthy weight. Heart rate and blood pressure. Body temperature. Skin for abnormal spots. Counseling Your health care provider may ask you questions about your: Past medical problems. Family's medical history. Alcohol, tobacco, and drug use. Emotional well-being. Home life and relationship well-being. Sexual activity. Diet, exercise, and sleep habits. Work and work environment. Access to firearms. What immunizations do I need? Vaccines are usually given at various ages, according to a schedule. Your health care provider will recommend vaccines for you based on your age, medical history, and lifestyle or other factors, such as travel or where you work. What tests do I need? Blood tests Lipid and cholesterol levels. These may be checked every 5 years, or more often if you are over 50 years old. Hepatitis C test. Hepatitis B test. Screening Lung cancer screening. You may have this screening every year starting at age 55 if you have a 30-pack-year history of smoking and currently smoke or have quit within the past 15 years. Prostate cancer screening. Recommendations will vary depending on your family history and other risks. Genital exam to check for testicular cancer  or hernias. Colorectal cancer screening. All adults should have this screening starting at age 50 and continuing until age 75. Your health care provider may recommend screening at age 45 if you are at increased risk. You will have tests every 1-10 years, depending on your results and the type of screening test. Diabetes screening. This is done by checking your blood sugar (glucose) after you have not eaten for a while (fasting). You may have this done every 1-3 years. STD (sexually transmitted disease) testing, if you are at risk. Follow these instructions at home: Eating and drinking  Eat a diet that includes fresh fruits and vegetables, whole grains, lean protein, and low-fat dairy products. Take vitamin and mineral supplements as recommended by your health care provider. Do not drink alcohol if your health care provider tells you not to drink. If you drink alcohol: Limit how much you have to 0-2 drinks a day. Be aware of how much alcohol is in your drink. In the [place] one drink equals one 12 oz bottle of beer (355 mL), one 5 oz glass of wine (148 mL), or one 1 oz glass of hard liquor (44 mL). Lifestyle Take daily care of your teeth and gums. Brush your teeth every morning and night with fluoride toothpaste. Floss one time each day. Stay active. Exercise for at least 30 minutes 5 or more days each week. Do not use any products that contain nicotine or tobacco, such as cigarettes, e-cigarettes, and chewing tobacco. If you need help quitting, ask your health care provider. Do not use drugs. If you are sexually active, practice safe sex. Use a condom or other form of protection to prevent STIs (sexually transmitted infections). If told by your   health care provider, take low-dose aspirin daily starting at age 50. Find healthy ways to cope with stress, such as: Meditation, yoga, or listening to music. Journaling. Talking to a trusted person. Spending time with friends and  family. Safety Always wear your seat belt while driving or riding in a vehicle. Do not drive: If you have been drinking alcohol. Do not ride with someone who has been drinking. When you are tired or distracted. While texting. Wear a helmet and other protective equipment during sports activities. If you have firearms in your house, make sure you follow all gun safety procedures. What's next? Go to your health care provider once a year for an annual wellness visit. Ask your health care provider how often you should have your eyes and teeth checked. Stay up to date on all vaccines. This information is not intended to replace advice given to you by your health care provider. Make sure you discuss any questions you have with your health care provider. Document Revised: 06/15/2020 Document Reviewed: 04/01/2018 Elsevier Patient Education  2022 Elsevier Inc.   

## 2021-02-15 ENCOUNTER — Encounter: Payer: Self-pay | Admitting: Family Medicine

## 2021-02-15 LAB — LIPID PANEL W/REFLEX DIRECT LDL
Cholesterol: 198 mg/dL (ref <200)
HDL: 54 mg/dL (ref >=40)
LDL Cholesterol (Calc): 127 mg/dL (calc) — ABNORMAL HIGH
Non-HDL Cholesterol (Calc): 144 mg/dL (calc) — ABNORMAL HIGH (ref <130)
Total CHOL/HDL Ratio: 3.7 (calc) (ref <5.0)
Triglycerides: 77 mg/dL (ref <150)

## 2021-02-15 LAB — CBC WITH DIFFERENTIAL/PLATELET
Absolute Monocytes: 472 cells/uL (ref 200–950)
Basophils Absolute: 30 cells/uL (ref 0–200)
Basophils Relative: 0.5 %
Eosinophils Absolute: 254 cells/uL (ref 15–500)
Eosinophils Relative: 4.3 %
HCT: 41 % (ref 38.5–50.0)
Hemoglobin: 13.9 g/dL (ref 13.2–17.1)
Lymphs Abs: 1841 cells/uL (ref 850–3900)
MCH: 29.7 pg (ref 27.0–33.0)
MCHC: 33.9 g/dL (ref 32.0–36.0)
MCV: 87.6 fL (ref 80.0–100.0)
MPV: 10.1 fL (ref 7.5–12.5)
Monocytes Relative: 8 %
Neutro Abs: 3304 cells/uL (ref 1500–7800)
Neutrophils Relative %: 56 %
Platelets: 251 10*3/uL (ref 140–400)
RBC: 4.68 10*6/uL (ref 4.20–5.80)
RDW: 13.1 % (ref 11.0–15.0)
Total Lymphocyte: 31.2 %
WBC: 5.9 10*3/uL (ref 3.8–10.8)

## 2021-02-15 LAB — COMPLETE METABOLIC PANEL WITH GFR
AG Ratio: 1.4 (calc) (ref 1.0–2.5)
ALT: 14 U/L (ref 9–46)
AST: 18 U/L (ref 10–35)
Albumin: 4.6 g/dL (ref 3.6–5.1)
Alkaline phosphatase (APISO): 97 U/L (ref 35–144)
BUN/Creatinine Ratio: 16 (calc) (ref 6–22)
BUN: 21 mg/dL (ref 7–25)
CO2: 31 mmol/L (ref 20–32)
Calcium: 9.9 mg/dL (ref 8.6–10.3)
Chloride: 103 mmol/L (ref 98–110)
Creat: 1.35 mg/dL — ABNORMAL HIGH (ref 0.70–1.30)
Globulin: 3.3 g/dL (calc) (ref 1.9–3.7)
Glucose, Bld: 92 mg/dL (ref 65–99)
Potassium: 4.6 mmol/L (ref 3.5–5.3)
Sodium: 140 mmol/L (ref 135–146)
Total Bilirubin: 0.8 mg/dL (ref 0.2–1.2)
Total Protein: 7.9 g/dL (ref 6.1–8.1)
eGFR: 64 mL/min/{1.73_m2} (ref >=60)

## 2021-02-15 LAB — PSA: PSA: 1.75 ng/mL (ref <=4.00)

## 2021-04-01 ENCOUNTER — Ambulatory Visit: Payer: 59

## 2021-04-10 ENCOUNTER — Encounter (HOSPITAL_COMMUNITY): Payer: Self-pay | Admitting: *Deleted

## 2021-05-14 ENCOUNTER — Ambulatory Visit: Payer: 59 | Admitting: Family Medicine

## 2021-05-16 ENCOUNTER — Ambulatory Visit: Payer: 59 | Admitting: Family Medicine

## 2021-05-16 ENCOUNTER — Encounter: Payer: Self-pay | Admitting: Family Medicine

## 2021-05-16 ENCOUNTER — Other Ambulatory Visit: Payer: Self-pay

## 2021-05-16 DIAGNOSIS — M170 Bilateral primary osteoarthritis of knee: Secondary | ICD-10-CM | POA: Diagnosis not present

## 2021-05-16 DIAGNOSIS — R058 Other specified cough: Secondary | ICD-10-CM | POA: Diagnosis not present

## 2021-05-16 MED ORDER — TAMSULOSIN HCL 0.4 MG PO CAPS: 0.4000 mg | ORAL_CAPSULE | Freq: Every day | ORAL | 3 refills | Status: DC

## 2021-05-16 MED ORDER — BUPROPION HCL ER (XL) 300 MG PO TB24: 300.0000 mg | ORAL_TABLET | Freq: Every day | ORAL | 3 refills | Status: DC

## 2021-05-16 MED ORDER — TRAMADOL HCL 50 MG PO TABS: 50.0000 mg | ORAL_TABLET | Freq: Three times a day (TID) | ORAL | 0 refills | Status: DC | PRN

## 2021-05-16 MED ORDER — SILDENAFIL CITRATE 100 MG PO TABS: 100.0000 mg | ORAL_TABLET | Freq: Every day | ORAL | 3 refills | Status: DC | PRN

## 2021-05-16 MED ORDER — MELOXICAM 15 MG PO TABS: 15.0000 mg | ORAL_TABLET | Freq: Every day | ORAL | 0 refills | Status: DC | PRN

## 2021-05-16 MED ORDER — TRAMADOL HCL 50 MG PO TABS: 50.0000 mg | ORAL_TABLET | Freq: Three times a day (TID) | ORAL | 0 refills | Status: AC | PRN

## 2021-05-16 MED ORDER — OLMESARTAN MEDOXOMIL-HCTZ 20-12.5 MG PO TABS: 1.0000 | ORAL_TABLET | Freq: Every day | ORAL | 3 refills | Status: DC

## 2021-05-16 NOTE — Assessment & Plan Note (Signed)
Recommend scheduling with Dr. Dianah Field to discuss viscosupplementation.  Adding meloxicam to replace ibuprofen.  Also adding tramadol as needed for severe breakthrough pain.

## 2021-05-16 NOTE — Assessment & Plan Note (Signed)
This is resolved at this point.  Pulmonary exam is benign.

## 2021-05-16 NOTE — Progress Notes (Signed)
Javier Joyce - 51 y.o. male MRN [id number]  Date of birth: 1971-01-02  Subjective Chief Complaint  Patient presents with   Cough    HPI Javier Joyce is a 51 year old male here today with complaint of cough.  Had cough that lasted for several days a few weeks ago.  This has pretty much resolved at this point.  He did not have any dyspnea, fever or chills.  He did receive COVID shot a few days prior to onset.  He continues to have difficulty with knee pain.  He has had injections previously with improvement.  I recommend that he schedule with Dr. Dianah Field to discuss viscosupplementation injections but has not had time to schedule yet.  He is using ibuprofen frequently which provide some relief.  ROS:  A comprehensive ROS was completed and negative except as noted per HPI  Allergies  Allergen Reactions   Lisinopril Cough   Losartan Cough    Pt has lots of swelling if he does not have diuretic with this medication     Past Medical History:  Diagnosis Date   Anxiety    Arthritis    knees    Complication of anesthesia    Pt has woken up during the procedure   Depression    Hypertension    Sleep apnea    past hx     Past Surgical History:  Procedure Laterality Date   ACHILLES TENDON REPAIR Bilateral 2008   LAPAROSCOPIC GASTRIC SLEEVE RESECTION N/A 09/15/2017   Procedure: LAPAROSCOPIC GASTRIC SLEEVE RESECTION WITH UPPER ENDO AND ERAS PATHWAY;  Surgeon: Johnathan Hausen, MD;  Location: [facility];  Service: General;  Laterality: N/A;   PATELLAR TENDON REPAIR Bilateral    right and left knee repair      Social History   Socioeconomic History   Marital status: Married    Spouse name: Not on file   Number of children: Not on file   Years of education: Not on file   Highest education level: Not on file  Occupational History   Not on file  Tobacco Use   Smoking status: Former    Years: 2.00    Types: Cigarettes    Quit date: 08/24/2017    Years since quitting: 3.7   Smokeless  tobacco: Never   Tobacco comments:    Social smoker  Vaping Use   Vaping Use: Never used  Substance and Sexual Activity   Alcohol use: Yes    Comment: occass   Drug use: Never   Sexual activity: Yes  Other Topics Concern   Not on file  Social History Narrative   Not on file   Social Determinants of Health   Financial Resource Strain: Not on file  Food Insecurity: Not on file  Transportation Needs: Not on file  Physical Activity: Not on file  Stress: Not on file  Social Connections: Not on file    Family History  Problem Relation Age of Onset   Other Father        SB blockage, + colostomy, ? cancer    Colon cancer Neg Hx    Colon polyps Neg Hx    Esophageal cancer Neg Hx    Rectal cancer Neg Hx    Stomach cancer Neg Hx     Health Maintenance  Topic Date Due   Hepatitis C Screening  Never done   COVID-19 Vaccine (4 - Booster for [place] series) 06/24/2021   TETANUS/TDAP  09/23/2026   COLONOSCOPY (Pts 45-83yrs Insurance coverage will  need to be confirmed)  10/13/2029   INFLUENZA VACCINE  Completed   HIV Screening  Completed   Zoster Vaccines- Shingrix  Completed   HPV VACCINES  Aged Out     ----------------------------------------------------------------------------------------------------------------------------------------------------------------------------------------------------------------- Physical Exam BP 112/74 (BP Location: Right Arm, Patient Position: Sitting, Cuff Size: Large)    Pulse 92    Temp 98.4 F (36.9 C) (Oral)    Ht 5' 11.5" (1.816 m)    Wt 255 lb (115.7 kg)    SpO2 98%    BMI 35.07 kg/m   Physical Exam Constitutional:      Appearance: Normal appearance.  Eyes:     General: No scleral icterus. Cardiovascular:     Rate and Rhythm: Normal rate and regular rhythm.  Pulmonary:     Effort: Pulmonary effort is normal.     Breath sounds: Normal breath sounds.  Musculoskeletal:     Cervical back: Neck supple.  Neurological:     Mental  Status: He is alert.  Psychiatric:        Mood and Affect: Mood normal.        Behavior: Behavior normal.    ------------------------------------------------------------------------------------------------------------------------------------------------------------------------------------------------------------------- Assessment and Plan  Bilateral primary osteoarthritis of knee Recommend scheduling with Dr. Dianah Field to discuss viscosupplementation.  Adding meloxicam to replace ibuprofen.  Also adding tramadol as needed for severe breakthrough pain.  Post-viral cough syndrome This is resolved at this point.  Pulmonary exam is benign.   Meds ordered this encounter  Medications   DISCONTD: buPROPion (WELLBUTRIN XL) 300 MG 24 hr tablet    Sig: Take 1 tablet (300 mg total) by mouth daily.    Dispense:  90 tablet    Refill:  3   olmesartan-hydrochlorothiazide (BENICAR HCT) 20-12.5 MG tablet    Sig: Take 1 tablet by mouth daily.    Dispense:  90 tablet    Refill:  3   tamsulosin (FLOMAX) 0.4 MG CAPS capsule    Sig: Take 1 capsule (0.4 mg total) by mouth daily.    Dispense:  90 capsule    Refill:  3   sildenafil (VIAGRA) 100 MG tablet    Sig: Take 1 tablet (100 mg total) by mouth daily as needed for erectile dysfunction.    Dispense:  45 tablet    Refill:  3   meloxicam (MOBIC) 15 MG tablet    Sig: Take 1 tablet (15 mg total) by mouth daily as needed for pain.    Dispense:  30 tablet    Refill:  0   DISCONTD: traMADol (ULTRAM) 50 MG tablet    Sig: Take 1 tablet (50 mg total) by mouth every 8 (eight) hours as needed for up to 5 days for moderate pain or severe pain.    Dispense:  15 tablet    Refill:  0   buPROPion (WELLBUTRIN XL) 300 MG 24 hr tablet    Sig: Take 1 tablet (300 mg total) by mouth daily.    Dispense:  90 tablet    Refill:  3   traMADol (ULTRAM) 50 MG tablet    Sig: Take 1 tablet (50 mg total) by mouth every 8 (eight) hours as needed for up to 5 days for  moderate pain or severe pain.    Dispense:  15 tablet    Refill:  0    Return in about 3 months (around 08/14/2021) for Knee pain.    This visit occurred during the SARS-CoV-2 public health emergency.  Safety protocols were in place, including screening  questions prior to the visit, additional usage of staff PPE, and extensive cleaning of exam room while observing appropriate contact time as indicated for disinfecting solutions.

## 2021-05-16 NOTE — Patient Instructions (Addendum)
Try meloxicam daily as needed.  You may use tramadol as needed in addition if still having severe pain after meloxicam.  Stop taking ibuprofen while using meloxicam.  Request community care referral to [facility] from [place].   Chronic medications sent to [facility].

## 2021-08-08 ENCOUNTER — Encounter: Payer: Self-pay | Admitting: Family Medicine

## 2021-08-08 ENCOUNTER — Ambulatory Visit: Payer: 59 | Admitting: Family Medicine

## 2021-08-08 DIAGNOSIS — M25562 Pain in left knee: Secondary | ICD-10-CM

## 2021-08-08 DIAGNOSIS — G8929 Other chronic pain: Secondary | ICD-10-CM

## 2021-08-08 DIAGNOSIS — R059 Cough, unspecified: Secondary | ICD-10-CM | POA: Diagnosis not present

## 2021-08-08 DIAGNOSIS — F320 Major depressive disorder, single episode, mild: Secondary | ICD-10-CM | POA: Diagnosis not present

## 2021-08-08 DIAGNOSIS — M25561 Pain in right knee: Secondary | ICD-10-CM | POA: Diagnosis not present

## 2021-08-08 DIAGNOSIS — I10 Essential (primary) hypertension: Secondary | ICD-10-CM

## 2021-08-08 DIAGNOSIS — G44219 Episodic tension-type headache, not intractable: Secondary | ICD-10-CM | POA: Insufficient documentation

## 2021-08-08 MED ORDER — MELOXICAM 15 MG PO TABS: 15.0000 mg | ORAL_TABLET | Freq: Every day | ORAL | 0 refills | Status: DC | PRN

## 2021-08-08 MED ORDER — BUPROPION HCL ER (XL) 300 MG PO TB24: 300.0000 mg | ORAL_TABLET | Freq: Every day | ORAL | 3 refills | Status: AC

## 2021-08-08 MED ORDER — TRAMADOL HCL 50 MG PO TABS: 50.0000 mg | ORAL_TABLET | Freq: Three times a day (TID) | ORAL | 1 refills | Status: DC | PRN

## 2021-08-08 MED ORDER — BUPROPION HCL ER (XL) 300 MG PO TB24: 300.0000 mg | ORAL_TABLET | Freq: Every day | ORAL | 3 refills | Status: DC

## 2021-08-08 NOTE — Patient Instructions (Signed)
I would recommend that you avoid [place]y/BC type powders.  This can create problems with your stomach.  ?Try adding prilosec daily for 2-3 weeks to see if cough improves.  ?I'll get you a letter sent through [facility].   ?

## 2021-08-08 NOTE — Assessment & Plan Note (Signed)
Stable with bupropion.  Continue at current strength.  

## 2021-08-08 NOTE — Progress Notes (Signed)
?[name] - 51 y.o. male MRN [id number]  Date of birth: 05-04-1970 ? ?Subjective ?No chief complaint on file. ? ? ?HPI ?Javier Joyce is a 51 y.o. male here today for follow up visit.  He reports that he is doing "ok" at this time .  He is s/p bariatric surgery (sleeve gastrectomy) ? ?BP has remained well controlled with benicar and diltiazem.  No side effects from medications.  He denies chest pain, shortness of breath, palpitations, headache or vision changes.  ? ?Continues to deal with chronic knee pain.  Uses meloxicam occasionally, he is aware to limit this.  Tramadol was also added previously.  He has had good relief with this.  Denies side effects from this.  He is using this sparingly.  He did see a PA through the [place] as well recently regarding his headaches.  Explained to her that he was not sleeping well due to knee pain.  His poor sleep and frequent repositioning while sleeping has led to tension type headaches.  He has used BC powders to help with this.  He is requesting a letter stating that his headaches are related to his poor sleep due his knee pain.   ? ?ROS:  A comprehensive ROS was completed and negative except as noted per HPI ? ?Allergies  ?Allergen Reactions  ? Lisinopril Cough  ? Losartan Cough  ?  Pt has lots of swelling if he does not have diuretic with this medication   ? ? ?Past Medical History:  ?Diagnosis Date  ? Anxiety   ? Arthritis   ? knees   ? Complication of anesthesia   ? Pt has woken up during the procedure  ? Depression   ? Hypertension   ? Sleep apnea   ? past hx   ? ? ?Past Surgical History:  ?Procedure Laterality Date  ? ACHILLES TENDON REPAIR Bilateral 2008  ? LAPAROSCOPIC GASTRIC SLEEVE RESECTION N/A 09/15/2017  ? Procedure: LAPAROSCOPIC GASTRIC SLEEVE RESECTION WITH UPPER ENDO AND ERAS PATHWAY;  Surgeon: Johnathan Hausen, MD;  Location: [facility];  Service: General;  Laterality: N/A;  ? PATELLAR TENDON REPAIR Bilateral   ? right and left knee repair    ? ? ?Social  History  ? ?Socioeconomic History  ? Marital status: Married  ?  Spouse name: Not on file  ? Number of children: Not on file  ? Years of education: Not on file  ? Highest education level: Not on file  ?Occupational History  ? Not on file  ?Tobacco Use  ? Smoking status: Former  ?  Years: 2.00  ?  Types: Cigarettes  ?  Quit date: 08/24/2017  ?  Years since quitting: 3.9  ? Smokeless tobacco: Never  ? Tobacco comments:  ?  Social smoker  ?Vaping Use  ? Vaping Use: Never used  ?Substance and Sexual Activity  ? Alcohol use: Yes  ?  Comment: occass  ? Drug use: Never  ? Sexual activity: Yes  ?Other Topics Concern  ? Not on file  ?Social History Narrative  ? Not on file  ? ?Social Determinants of Health  ? ?Financial Resource Strain: Not on file  ?Food Insecurity: Not on file  ?Transportation Needs: Not on file  ?Physical Activity: Not on file  ?Stress: Not on file  ?Social Connections: Not on file  ? ? ?Family History  ?Problem Relation Age of Onset  ? Other Father   ?     SB blockage, + colostomy, ? cancer   ?  Colon cancer Neg Hx   ? Colon polyps Neg Hx   ? Esophageal cancer Neg Hx   ? Rectal cancer Neg Hx   ? Stomach cancer Neg Hx   ? ? ?Health Maintenance  ?Topic Date Due  ? Hepatitis C Screening  Never done  ? COVID-19 Vaccine (4 - Booster for [employer] series) 06/24/2021  ? INFLUENZA VACCINE  11/19/2021  ? TETANUS/TDAP  09/23/2026  ? COLONOSCOPY (Pts 45-84yr Insurance coverage will need to be confirmed)  10/13/2029  ? HIV Screening  Completed  ? Zoster Vaccines- Shingrix  Completed  ? HPV VACCINES  Aged Out  ? ? ? ?----------------------------------------------------------------------------------------------------------------------------------------------------------------------------------------------------------------- ?Physical Exam ?BP 121/80 (BP Location: Left Arm, Patient Position: Sitting, Cuff Size: Large)   Pulse 74   Ht 5' 11.5" (1.816 m)   Wt 251 lb (113.9 kg)   SpO2 99%   BMI 34.52 kg/m?  ? ?Physical  Exam ?Constitutional:   ?   Appearance: Normal appearance.  ?Neurological:  ?   Mental Status: He is alert.  ?Psychiatric:     ?   Mood and Affect: Mood normal.     ?   Behavior: Behavior normal.  ? ? ?------------------------------------------------------------------------------------------------------------------------------------------------------------------------------------------------------------------- ?Assessment and Plan ? ?Bilateral knee pain ?He has chronic bilateral knee pain.  This does affect his sleep patterns required him to change positions frequently while trying to sleep.  This has created tension headaches for him due to his poor sleep.  We will continue tramadol as needed for knee pain as we are trying to limit NSAIDs for him. ? ?MDD (major depressive disorder), single episode ?Stable with bupropion.  Continue at current strength. ? ?Essential hypertension ?Blood pressure remains well controlled.  Recommend continuation of current medications for management of hypertension. ? ?Cough ?Possible related to reflux.  Recommend trial of Prilosec. ? ?Episodic tension type headache ?Related to poor sleep due to his chronic knee pain.  He has lost a significant amount of weight since his sleeve gastrectomy.  Low suspicion for OSA at this time. ? ? ?No orders of the defined types were placed in this encounter. ? ? ?No follow-ups on file. ? ? ? ?This visit occurred during the SARS-CoV-2 public health emergency.  Safety protocols were in place, including screening questions prior to the visit, additional usage of staff PPE, and extensive cleaning of exam room while observing appropriate contact time as indicated for disinfecting solutions.  ? ?

## 2021-08-08 NOTE — Assessment & Plan Note (Signed)
He has chronic bilateral knee pain.  This does affect his sleep patterns required him to change positions frequently while trying to sleep.  This has created tension headaches for him due to his poor sleep.  We will continue tramadol as needed for knee pain as we are trying to limit NSAIDs for him. ?

## 2021-08-08 NOTE — Assessment & Plan Note (Signed)
Blood pressure remains well controlled.  Recommend continuation of current medications for management of hypertension. ?

## 2021-08-08 NOTE — Assessment & Plan Note (Signed)
Related to poor sleep due to his chronic knee pain.  He has lost a significant amount of weight since his sleeve gastrectomy.  Low suspicion for OSA at this time. ?

## 2021-08-08 NOTE — Assessment & Plan Note (Signed)
Possible related to reflux.  Recommend trial of Prilosec. ?

## 2021-08-16 ENCOUNTER — Ambulatory Visit: Payer: 59 | Admitting: Family Medicine

## 2021-08-22 ENCOUNTER — Ambulatory Visit: Payer: 59 | Admitting: Family Medicine

## 2021-09-13 ENCOUNTER — Other Ambulatory Visit: Payer: Self-pay | Admitting: Family Medicine

## 2021-12-17 ENCOUNTER — Ambulatory Visit: Payer: 59 | Admitting: Family Medicine

## 2021-12-17 DIAGNOSIS — I10 Essential (primary) hypertension: Secondary | ICD-10-CM

## 2021-12-17 DIAGNOSIS — R339 Retention of urine, unspecified: Secondary | ICD-10-CM

## 2021-12-17 DIAGNOSIS — K602 Anal fissure, unspecified: Secondary | ICD-10-CM

## 2021-12-17 DIAGNOSIS — G44219 Episodic tension-type headache, not intractable: Secondary | ICD-10-CM

## 2021-12-17 MED ORDER — OLMESARTAN MEDOXOMIL-HCTZ 20-12.5 MG PO TABS: 1.0000 | ORAL_TABLET | Freq: Every day | ORAL | 3 refills | Status: DC

## 2021-12-17 MED ORDER — AMITRIPTYLINE HCL 25 MG PO TABS: ORAL_TABLET | ORAL | 0 refills | Status: AC

## 2021-12-17 MED ORDER — LIDOCAINE 5 % EX OINT: 1.0000 | TOPICAL_OINTMENT | Freq: Four times a day (QID) | CUTANEOUS | 0 refills | Status: DC | PRN

## 2021-12-17 NOTE — Patient Instructions (Addendum)
We'll be in touch with lab results.   Let's try amitriptyline for tension headaches.  Start 1/2 tab x10 days then increase to a full tab.   Continue nifedipine ointment to rectal area. I have also sent in lidocaine ointment.   See me again in 2 months.

## 2021-12-18 LAB — URINALYSIS, ROUTINE W REFLEX MICROSCOPIC
Bilirubin Urine: NEGATIVE
Glucose, UA: NEGATIVE
Hgb urine dipstick: NEGATIVE
Leukocytes,Ua: NEGATIVE
Nitrite: NEGATIVE
Protein, ur: NEGATIVE
Specific Gravity, Urine: 1.022 (ref 1.001–1.035)
pH: 6 (ref 5.0–8.0)

## 2021-12-18 LAB — URINE CULTURE
MICRO NUMBER:: 13849871
Result:: NO GROWTH
SPECIMEN QUALITY:: ADEQUATE

## 2021-12-18 LAB — PSA: PSA: 0.57 ng/mL (ref <=4.00)

## 2021-12-19 ENCOUNTER — Other Ambulatory Visit: Payer: Self-pay | Admitting: Family Medicine

## 2021-12-19 ENCOUNTER — Encounter: Payer: Self-pay | Admitting: Family Medicine

## 2021-12-19 DIAGNOSIS — R3911 Hesitancy of micturition: Secondary | ICD-10-CM

## 2021-12-23 ENCOUNTER — Encounter: Payer: Self-pay | Admitting: Family Medicine

## 2021-12-23 DIAGNOSIS — R339 Retention of urine, unspecified: Secondary | ICD-10-CM | POA: Insufficient documentation

## 2021-12-23 NOTE — Assessment & Plan Note (Signed)
Has had recurrence of anal fissure.  Recommend increasing his fiber intake as well as fluid intake.  Continue nifedipine ointment.  Adding topical lidocaine as needed.

## 2021-12-23 NOTE — Assessment & Plan Note (Signed)
Check urinalysis.  Updating PSA levels.

## 2021-12-23 NOTE — Assessment & Plan Note (Signed)
Blood pressure remains well controlled with current medications.  We will plan to continue.  Refills provided.

## 2021-12-23 NOTE — Progress Notes (Signed)
Javier Joyce - 51 y.o. male MRN [id number]  Date of birth: 1970/10/23  Subjective Chief Complaint  Patient presents with   Headache    HPI Javier Joyce is a 51 year old male here today for follow-up visit.    He is having problems with anal fissure again.  He does have leftover nifedipine ointment which she has started using.  This worked pretty well for him in the past.  He would like something to help with stinging pain.  He is increasing his fiber intake.  Has had increased frequency of headaches.  Headaches are more tension type.  Has tightness in his head and neck area.  Has not tried on medications for migraines however these did not seem to help a whole lot.  Reports difficulty with urination.  Continues to have difficulty starting his stream, decreased flow and dribbling.  He is taking Flomax but this does not seem to help very much.  Blood pressure remains pretty well controlled at this time.  Continue home olmesartan with hydrochlorothiazide and diltiazem.  Tolerating current medications well without side effects.  Denies chest pain, shortness of breath, palpitations, headaches or vision changes.  ROS:  A comprehensive ROS was completed and negative except as noted per HPI     He is having some increased rectal pain.  History of fissure with similar symptoms at this time. Allergies  Allergen Reactions   Lisinopril Cough   Losartan Cough    Pt has lots of swelling if he does not have diuretic with this medication     Past Medical History:  Diagnosis Date   Anxiety    Arthritis    knees    Complication of anesthesia    Pt has woken up during the procedure   Depression    Hypertension    Sleep apnea    past hx     Past Surgical History:  Procedure Laterality Date   ACHILLES TENDON REPAIR Bilateral 2008   LAPAROSCOPIC GASTRIC SLEEVE RESECTION N/A 09/15/2017   Procedure: LAPAROSCOPIC GASTRIC SLEEVE RESECTION WITH UPPER ENDO AND ERAS PATHWAY;  Surgeon: Johnathan Hausen,  MD;  Location: [facility];  Service: General;  Laterality: N/A;   PATELLAR TENDON REPAIR Bilateral    right and left knee repair      Social History   Socioeconomic History   Marital status: Married    Spouse name: Not on file   Number of children: Not on file   Years of education: Not on file   Highest education level: Not on file  Occupational History   Not on file  Tobacco Use   Smoking status: Former    Years: 2.00    Types: Cigarettes    Quit date: 08/24/2017    Years since quitting: 4.3   Smokeless tobacco: Never   Tobacco comments:    Social smoker  Vaping Use   Vaping Use: Never used  Substance and Sexual Activity   Alcohol use: Yes    Comment: occass   Drug use: Never   Sexual activity: Yes  Other Topics Concern   Not on file  Social History Narrative   Not on file   Social Determinants of Health   Financial Resource Strain: Not on file  Food Insecurity: No Food Insecurity (05/06/2017)   Hunger Vital Sign    Worried About Running Out of Food in the Last Year: Never true    Ran Out of Food in the Last Year: Never true  Transportation Needs: Not on file  Physical Activity: Not on file  Stress: Not on file  Social Connections: Not on file    Family History  Problem Relation Age of Onset   Other Father        SB blockage, + colostomy, ? cancer    Colon cancer Neg Hx    Colon polyps Neg Hx    Esophageal cancer Neg Hx    Rectal cancer Neg Hx    Stomach cancer Neg Hx     Health Maintenance  Topic Date Due   COVID-19 Vaccine (4 - Pfizer series) 06/24/2021   INFLUENZA VACCINE  11/19/2021   Hepatitis C Screening  08/09/2022 (Originally 08/31/1988)   TETANUS/TDAP  09/23/2026   COLONOSCOPY (Pts 45-54yr Insurance coverage will need to be confirmed)  10/13/2029   HIV Screening  Completed   Zoster Vaccines- Shingrix  Completed   HPV VACCINES  Aged Out      ----------------------------------------------------------------------------------------------------------------------------------------------------------------------------------------------------------------- Physical Exam There were no vitals taken for this visit.  Physical Exam Constitutional:      Appearance: He is well-developed.  Cardiovascular:     Rate and Rhythm: Normal rate and regular rhythm.  Pulmonary:     Effort: Pulmonary effort is normal.     Breath sounds: Normal breath sounds.  Neurological:     General: No focal deficit present.     Mental Status: He is alert.  Psychiatric:        Mood and Affect: Mood normal.        Behavior: Behavior normal.     ------------------------------------------------------------------------------------------------------------------------------------------------------------------------------------------------------------------- Assessment and Plan  Essential hypertension Blood pressure remains well controlled with current medications.  We will plan to continue.  Refills provided.  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  Recommend increasing his fiber intake as well as fluid intake.  Continue nifedipine ointment.  Adding topical lidocaine as needed.  Urinary retention Check urinalysis.  Updating PSA levels.  Episodic tension type headache Adding trial of amitriptyline for prevention of tension headaches.  Initially starting 12.5 mg once x7 to 10 days then increase to 25 mg.  Side effects reviewed.   Meds ordered this encounter  Medications   olmesartan-hydrochlorothiazide (BENICAR HCT) 20-12.5 MG tablet    Sig: Take 1 tablet by mouth daily.    Dispense:  90 tablet    Refill:  3   amitriptyline (ELAVIL) 25 MG tablet    Sig: Take 1/2 tab x10 days then increase to full tab    Dispense:  90 tablet    Refill:  0   lidocaine (XYLOCAINE) 5 % ointment    Sig: Apply 1 Application topically 4 (four) times daily as  needed.    Dispense:  50 g    Refill:  0    Return in about 2 months (around 02/16/2022).    This visit occurred during the SARS-CoV-2 public health emergency.  Safety protocols were in place, including screening questions prior to the visit, additional usage of staff PPE, and extensive cleaning of exam room while observing appropriate contact time as indicated for disinfecting solutions.

## 2021-12-23 NOTE — Assessment & Plan Note (Signed)
Adding trial of amitriptyline for prevention of tension headaches.  Initially starting 12.5 mg once x7 to 10 days then increase to 25 mg.  Side effects reviewed.

## 2022-01-30 ENCOUNTER — Encounter: Payer: Self-pay | Admitting: Family Medicine

## 2022-02-07 ENCOUNTER — Ambulatory Visit: Payer: 59 | Admitting: Family Medicine

## 2022-02-17 ENCOUNTER — Ambulatory Visit: Payer: 59 | Admitting: Family Medicine

## 2022-04-04 ENCOUNTER — Encounter (HOSPITAL_COMMUNITY): Payer: Self-pay | Admitting: *Deleted

## 2022-05-24 ENCOUNTER — Other Ambulatory Visit: Payer: Self-pay | Admitting: Family Medicine

## 2022-06-23 ENCOUNTER — Ambulatory Visit (INDEPENDENT_AMBULATORY_CARE_PROVIDER_SITE_OTHER): Payer: 59 | Admitting: Family Medicine

## 2022-06-23 ENCOUNTER — Encounter: Payer: Self-pay | Admitting: Family Medicine

## 2022-06-23 VITALS — BP 130/70 | HR 67 | Ht 72.0 in | Wt 258.0 lb

## 2022-06-23 DIAGNOSIS — I10 Essential (primary) hypertension: Secondary | ICD-10-CM | POA: Diagnosis not present

## 2022-06-23 DIAGNOSIS — R339 Retention of urine, unspecified: Secondary | ICD-10-CM | POA: Diagnosis not present

## 2022-06-23 DIAGNOSIS — R4189 Other symptoms and signs involving cognitive functions and awareness: Secondary | ICD-10-CM

## 2022-06-23 DIAGNOSIS — Z0000 Encounter for general adult medical examination without abnormal findings: Secondary | ICD-10-CM

## 2022-06-23 DIAGNOSIS — M170 Bilateral primary osteoarthritis of knee: Secondary | ICD-10-CM

## 2022-06-23 DIAGNOSIS — R519 Headache, unspecified: Secondary | ICD-10-CM

## 2022-06-23 DIAGNOSIS — E010 Iodine-deficiency related diffuse (endemic) goiter: Secondary | ICD-10-CM | POA: Diagnosis not present

## 2022-06-23 DIAGNOSIS — Z9884 Bariatric surgery status: Secondary | ICD-10-CM

## 2022-06-23 DIAGNOSIS — Z13220 Encounter for screening for lipoid disorders: Secondary | ICD-10-CM

## 2022-06-23 DIAGNOSIS — G44219 Episodic tension-type headache, not intractable: Secondary | ICD-10-CM

## 2022-06-23 MED ORDER — MELOXICAM 15 MG PO TABS: 15.0000 mg | ORAL_TABLET | Freq: Every day | ORAL | 11 refills | Status: AC | PRN

## 2022-06-23 MED ORDER — OLMESARTAN MEDOXOMIL-HCTZ 20-12.5 MG PO TABS: 1.0000 | ORAL_TABLET | Freq: Every day | ORAL | 3 refills | Status: DC

## 2022-06-23 MED ORDER — TRAMADOL HCL 50 MG PO TABS: 50.0000 mg | ORAL_TABLET | Freq: Three times a day (TID) | ORAL | 1 refills | Status: DC | PRN

## 2022-06-23 MED ORDER — TAMSULOSIN HCL 0.4 MG PO CAPS: 0.4000 mg | ORAL_CAPSULE | Freq: Every day | ORAL | 3 refills | Status: DC

## 2022-06-23 NOTE — Assessment & Plan Note (Signed)
He has not had any recent imaging and is having increased frequency of headaches as well as changes to his memory.  MRI ordered.

## 2022-06-23 NOTE — Progress Notes (Signed)
Javier Joyce - 52 y.o. male MRN [id number]  Date of birth: [birth year]  Subjective Chief Complaint  Patient presents with   Annual Exam   NEEDS NEXUS LETTER HPI Javier Joyce is a 52 y.o. male here today for annual exam.   He reports some difficulty with memory over the past several months.  Difficulty with recalling faces and names.  He does have history of chronic headaches which seem to be getting worse as well.  Typically having headache several days per week.  He does have history of sleeve gastrectomy.  No history of significant nutritional deficiencies in the past.  Having more knee pain.  Not well-controlled by meloxicam or tramadol at this point.  He has had injections in the past which were successfully.  He never had viscosupplementation injections.  Some increased depressive symptoms related to some of his other chronic conditions.  Activity level has been decreased some due to knee pain. Diet is pretty good.  Non-smoker at this time.  Denies significant alcohol intake.  Review of Systems  Constitutional:  Negative for chills, fever, malaise/fatigue and weight loss.  HENT:  Negative for congestion, ear pain and sore throat.   Eyes:  Negative for blurred vision, double vision and pain.  Respiratory:  Negative for cough and shortness of breath.   Cardiovascular:  Negative for chest pain and palpitations.  Gastrointestinal:  Negative for abdominal pain, blood in stool, constipation, heartburn and nausea.  Genitourinary:  Negative for dysuria and urgency.  Musculoskeletal:  Negative for joint pain and myalgias.  Neurological:  Negative for dizziness and headaches.  Endo/Heme/Allergies:  Does not bruise/bleed easily.  Psychiatric/Behavioral:  Negative for depression. The patient is not nervous/anxious and does not have insomnia.     Allergies  Allergen Reactions   Lisinopril Cough   Losartan Cough    Pt has lots of swelling if he does not have diuretic with this  medication     Past Medical History:  Diagnosis Date   Anxiety    Arthritis    knees    Complication of anesthesia    Pt has woken up during the procedure   Depression    Hypertension    Sleep apnea    past hx     Past Surgical History:  Procedure Laterality Date   ACHILLES TENDON REPAIR Bilateral 2008   LAPAROSCOPIC GASTRIC SLEEVE RESECTION N/A 09/15/2017   Procedure: LAPAROSCOPIC GASTRIC SLEEVE RESECTION WITH UPPER ENDO AND ERAS PATHWAY;  Surgeon: Johnathan Hausen, MD;  Location: [facility];  Service: General;  Laterality: N/A;   PATELLAR TENDON REPAIR Bilateral    right and left knee repair      Social History   Socioeconomic History   Marital status: Married    Spouse name: Not on file   Number of children: Not on file   Years of education: Not on file   Highest education level: Not on file  Occupational History   Not on file  Tobacco Use   Smoking status: Former    Years: 2.00    Types: Cigarettes    Quit date: 08/24/2017    Years since quitting: 4.8   Smokeless tobacco: Never   Tobacco comments:    Social smoker  Vaping Use   Vaping Use: Never used  Substance and Sexual Activity   Alcohol use: Yes    Comment: occass   Drug use: Never   Sexual activity: Yes  Other Topics Concern   Not on file  Social History  Narrative   Not on file   Social Determinants of Health   Financial Resource Strain: Not on file  Food Insecurity: No Food Insecurity (05/06/2017)   Hunger Vital Sign    Worried About Running Out of Food in the Last Year: Never true    Ran Out of Food in the Last Year: Never true  Transportation Needs: Not on file  Physical Activity: Not on file  Stress: Not on file  Social Connections: Not on file    Family History  Problem Relation Age of Onset   Other Father        SB blockage, + colostomy, ? cancer    Colon cancer Neg Hx    Colon polyps Neg Hx    Esophageal cancer Neg Hx    Rectal cancer Neg Hx    Stomach cancer Neg Hx     Health  Maintenance  Topic Date Due   COVID-19 Vaccine (5 - 2023-24 season) 06/17/2022   Hepatitis C Screening  08/09/2022 (Originally 08/31/1987)   DTaP/Tdap/Td (2 - Td or Tdap) 09/23/2026   COLONOSCOPY (Pts 45-35yr Insurance coverage will need to be confirmed)  10/13/2029   INFLUENZA VACCINE  Completed   HIV Screening  Completed   Zoster Vaccines- Shingrix  Completed   HPV VACCINES  Aged Out     ----------------------------------------------------------------------------------------------------------------------------------------------------------------------------------------------------------------- Physical Exam BP 130/70   Pulse 67   Ht 6' (1.829 m)   Wt 258 lb (117 kg)   SpO2 99%   BMI 34.99 kg/m   Physical Exam Constitutional:      General: He is not in acute distress. HENT:     Head: Normocephalic and atraumatic.     Right Ear: Tympanic membrane and external ear normal.     Left Ear: Tympanic membrane and external ear normal.  Eyes:     General: No scleral icterus. Neck:     Thyroid: No thyromegaly.  Cardiovascular:     Rate and Rhythm: Normal rate and regular rhythm.     Heart sounds: Normal heart sounds.  Pulmonary:     Effort: Pulmonary effort is normal.     Breath sounds: Normal breath sounds.  Abdominal:     General: Bowel sounds are normal. There is no distension.     Palpations: Abdomen is soft.     Tenderness: There is no abdominal tenderness. There is no guarding.  Musculoskeletal:     Cervical back: Normal range of motion.  Lymphadenopathy:     Cervical: No cervical adenopathy.  Skin:    General: Skin is warm and dry.     Findings: No rash.  Neurological:     Mental Status: He is alert and oriented to person, place, and time.     Cranial Nerves: No cranial nerve deficit.     Motor: No abnormal muscle tone.  Psychiatric:        Mood and Affect: Mood normal.        Behavior: Behavior normal.      ------------------------------------------------------------------------------------------------------------------------------------------------------------------------------------------------------------------- Assessment and Plan  Bilateral primary osteoarthritis of knee Continues to have bilateral knee pain.  Has had injections previously which helped.  I would like for her to see Dr. TDianah Fieldat this point to discuss Visco  Episodic tension type headache He has not had any recent imaging and is having increased frequency of headaches as well as changes to his memory.  MRI ordered.  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  Well adult exam Well adult  Orders Placed This Encounter  Procedures   MR Brain Wo Contrast    Standing Status:   Future    Standing Expiration Date:   06/23/2023    Order Specific Question:   What is the patient's sedation requirement?    Answer:   No Sedation    Order Specific Question:   Does the patient have a pacemaker or implanted devices?    Answer:   No    Order Specific Question:   Preferred imaging location?    Answer:   Product/process development scientist (table limit-350lbs)   PSA   TSH   Lipid Panel w/reflex Direct LDL   COMPLETE METABOLIC PANEL WITH GFR   CBC with Differential/Platelet   B12   RPR   Zinc   Vitamin D (25 hydroxy)  Screenings: Per lab orders Immunizations: Up-to-date Anticipatory guidance/affect reduction: Recommendations per AVS   Meds ordered this encounter  Medications   meloxicam (MOBIC) 15 MG tablet    Sig: Take 1 tablet (15 mg total) by mouth daily as needed. for pain    Dispense:  30 tablet    Refill:  11    Requesting 1 year supply   olmesartan-hydrochlorothiazide (BENICAR HCT) 20-12.5 MG tablet    Sig: Take 1 tablet by mouth daily.    Dispense:  90 tablet    Refill:  3   tamsulosin (FLOMAX) 0.4 MG CAPS capsule    Sig: Take 1 capsule (0.4 mg total) by mouth  daily.    Dispense:  90 capsule    Refill:  3   traMADol (ULTRAM) 50 MG tablet    Sig: Take 1 tablet (50 mg total) by mouth every 8 (eight) hours as needed for moderate pain or severe pain.    Dispense:  90 tablet    Refill:  1    Return in about 6 months (around 12/24/2022) for HTN.    This visit occurred during the SARS-CoV-2 public health emergency.  Safety protocols were in place, including screening questions prior to the visit, additional usage of staff PPE, and extensive cleaning of exam room while observing appropriate contact time as indicated for disinfecting solutions.

## 2022-06-23 NOTE — Assessment & Plan Note (Signed)
Well adult Orders Placed This Encounter  Procedures   MR Brain Wo Contrast    Standing Status:   Future    Standing Expiration Date:   06/23/2023    Order Specific Question:   What is the patient's sedation requirement?    Answer:   No Sedation    Order Specific Question:   Does the patient have a pacemaker or implanted devices?    Answer:   No    Order Specific Question:   Preferred imaging location?    Answer:   Product/process development scientist (table limit-350lbs)   PSA   TSH   Lipid Panel w/reflex Direct LDL   COMPLETE METABOLIC PANEL WITH GFR   CBC with Differential/Platelet   B12   RPR   Zinc   Vitamin D (25 hydroxy)  Screenings: Per lab orders Immunizations: Up-to-date Anticipatory guidance/affect reduction: Recommendations per AVS

## 2022-06-23 NOTE — Assessment & Plan Note (Signed)
Continues to have bilateral knee pain.  Has had injections previously which helped.  I would like for her to see Dr. Dianah Field at this point to discuss Visco

## 2022-06-23 NOTE — Assessment & Plan Note (Signed)
Check nutritional status and vitamin levels.

## 2022-06-23 NOTE — Patient Instructions (Signed)

## 2022-06-24 ENCOUNTER — Other Ambulatory Visit: Payer: Self-pay | Admitting: Family Medicine

## 2022-06-24 ENCOUNTER — Encounter: Payer: Self-pay | Admitting: Family Medicine

## 2022-06-24 MED ORDER — VITAMIN D (ERGOCALCIFEROL) 1.25 MG (50000 UNIT) PO CAPS: 50000.0000 [IU] | ORAL_CAPSULE | ORAL | 1 refills | Status: DC

## 2022-07-01 ENCOUNTER — Encounter: Payer: Self-pay | Admitting: Sports Medicine

## 2022-07-01 ENCOUNTER — Ambulatory Visit: Payer: 59 | Admitting: Sports Medicine

## 2022-07-01 VITALS — BP 107/70 | HR 72

## 2022-07-01 DIAGNOSIS — M170 Bilateral primary osteoarthritis of knee: Secondary | ICD-10-CM

## 2022-07-01 NOTE — Assessment & Plan Note (Signed)
Pleasant 52 year old male, bilateral knee osteoarthritis, moderate on x-rays back in 2020, he has not really taken the meloxicam yet just the tramadol, he did get excessively sedated with tramadol so we will discontinue this. He will do meloxicam daily with meals and hydration. He will also do some home physical therapy and I would like updated knee x-rays. I would like to see him back in about a month and if insufficient improvement we will consider bilateral steroid injections.

## 2022-07-01 NOTE — Progress Notes (Signed)
    Procedures performed today:    None.  Independent interpretation of notes and tests performed by another provider:   None.  Brief History, Exam, Impression, and Recommendations:    Bilateral primary osteoarthritis of knee Pleasant 52 year old male, bilateral knee osteoarthritis, moderate on x-rays back in 2020, he has not really taken the meloxicam yet just the tramadol, he did get excessively sedated with tramadol so we will discontinue this. He will do meloxicam daily with meals and hydration. He will also do some home physical therapy and I would like updated knee x-rays. I would like to see him back in about a month and if insufficient improvement we will consider bilateral steroid injections.    ____________________________________________ Gwen Her. Dianah Field, M.D., ABFM., CAQSM., AME. Primary Care and Sports Medicine [facility]  Adjunct Professor of [employer]  Risk manager

## 2022-07-06 ENCOUNTER — Ambulatory Visit: Payer: 59

## 2022-07-06 ENCOUNTER — Ambulatory Visit (INDEPENDENT_AMBULATORY_CARE_PROVIDER_SITE_OTHER): Payer: 59

## 2022-07-06 DIAGNOSIS — M170 Bilateral primary osteoarthritis of knee: Secondary | ICD-10-CM

## 2022-07-06 DIAGNOSIS — M25561 Pain in right knee: Secondary | ICD-10-CM

## 2022-07-06 DIAGNOSIS — M25562 Pain in left knee: Secondary | ICD-10-CM | POA: Diagnosis not present

## 2022-07-07 LAB — CBC WITH DIFFERENTIAL/PLATELET
Absolute Monocytes: 489 cells/uL (ref 200–950)
Basophils Absolute: 29 cells/uL (ref 0–200)
Basophils Relative: 0.4 %
Eosinophils Absolute: 263 cells/uL (ref 15–500)
Eosinophils Relative: 3.6 %
HCT: 41.4 % (ref 38.5–50.0)
Hemoglobin: 14.5 g/dL (ref 13.2–17.1)
Lymphs Abs: 2329 cells/uL (ref 850–3900)
MCH: 30.3 pg (ref 27.0–33.0)
MCHC: 35 g/dL (ref 32.0–36.0)
MCV: 86.4 fL (ref 80.0–100.0)
MPV: 10.4 fL (ref 7.5–12.5)
Monocytes Relative: 6.7 %
Neutro Abs: 4190 cells/uL (ref 1500–7800)
Neutrophils Relative %: 57.4 %
Platelets: 223 10*3/uL (ref 140–400)
RBC: 4.79 10*6/uL (ref 4.20–5.80)
RDW: 12.9 % (ref 11.0–15.0)
Total Lymphocyte: 31.9 %
WBC: 7.3 10*3/uL (ref 3.8–10.8)

## 2022-07-07 LAB — LIPID PANEL W/REFLEX DIRECT LDL
Cholesterol: 210 mg/dL — ABNORMAL HIGH (ref <200)
HDL: 59 mg/dL (ref >=40)
LDL Cholesterol (Calc): 129 mg/dL (calc) — ABNORMAL HIGH
Non-HDL Cholesterol (Calc): 151 mg/dL (calc) — ABNORMAL HIGH (ref <130)
Total CHOL/HDL Ratio: 3.6 (calc) (ref <5.0)
Triglycerides: 109 mg/dL (ref <150)

## 2022-07-07 LAB — COMPLETE METABOLIC PANEL WITH GFR
AG Ratio: 1.5 (calc) (ref 1.0–2.5)
ALT: 18 U/L (ref 9–46)
AST: 25 U/L (ref 10–35)
Albumin: 4.7 g/dL (ref 3.6–5.1)
Alkaline phosphatase (APISO): 83 U/L (ref 35–144)
BUN/Creatinine Ratio: 13 (calc) (ref 6–22)
BUN: 17 mg/dL (ref 7–25)
CO2: 30 mmol/L (ref 20–32)
Calcium: 9.8 mg/dL (ref 8.6–10.3)
Chloride: 101 mmol/L (ref 98–110)
Creat: 1.35 mg/dL — ABNORMAL HIGH (ref 0.70–1.30)
Globulin: 3.1 g/dL (calc) (ref 1.9–3.7)
Glucose, Bld: 81 mg/dL (ref 65–99)
Potassium: 4 mmol/L (ref 3.5–5.3)
Sodium: 139 mmol/L (ref 135–146)
Total Bilirubin: 0.9 mg/dL (ref 0.2–1.2)
Total Protein: 7.8 g/dL (ref 6.1–8.1)
eGFR: 63 mL/min/{1.73_m2} (ref >=60)

## 2022-07-07 LAB — PSA: PSA: 0.56 ng/mL (ref <=4.00)

## 2022-07-07 LAB — TSH: TSH: 1.81 mIU/L (ref 0.40–4.50)

## 2022-07-07 LAB — VITAMIN B12: Vitamin B-12: 377 pg/mL (ref 200–1100)

## 2022-07-07 LAB — VITAMIN D 25 HYDROXY (VIT D DEFICIENCY, FRACTURES): Vit D, 25-Hydroxy: 13 ng/mL — ABNORMAL LOW (ref 30–100)

## 2022-07-07 LAB — ZINC: Zinc: 57 ug/dL — ABNORMAL LOW (ref 60–130)

## 2022-07-07 LAB — RPR: RPR Ser Ql: NONREACTIVE

## 2022-07-08 ENCOUNTER — Encounter: Payer: Self-pay | Admitting: Family Medicine

## 2022-07-09 ENCOUNTER — Other Ambulatory Visit: Payer: Self-pay | Admitting: Family Medicine

## 2022-07-09 MED ORDER — TRIAZOLAM 0.125 MG PO TABS: ORAL_TABLET | ORAL | 0 refills | Status: AC

## 2022-07-11 NOTE — Telephone Encounter (Signed)
Can we see if we can schedule him at [facility] for MRI in their [place] they have in [place]?  Phone: [phone]  Thanks!

## 2022-07-14 NOTE — Telephone Encounter (Signed)
Task completed. At the request of [facility] dept, the MRI order was faxed to their office at [phone]. Fax transmission was successful. Patient has been updated via [employer].

## 2022-07-21 NOTE — Telephone Encounter (Signed)
[facility] at [phone] to get clarification on additional request. No answer, left a detailed vm msg and requested a call back from the office. Direct call back info provided.

## 2022-08-01 ENCOUNTER — Ambulatory Visit: Payer: 59 | Admitting: Sports Medicine

## 2022-08-04 ENCOUNTER — Encounter: Payer: Self-pay | Admitting: *Deleted

## 2022-08-14 ENCOUNTER — Other Ambulatory Visit: Payer: Self-pay | Admitting: Family Medicine

## 2022-12-24 ENCOUNTER — Ambulatory Visit: Payer: 59 | Admitting: Family Medicine

## 2023-01-20 ENCOUNTER — Encounter: Payer: Self-pay | Admitting: Family Medicine

## 2023-01-20 ENCOUNTER — Ambulatory Visit: Payer: 59 | Admitting: Family Medicine

## 2023-01-20 VITALS — BP 113/72 | HR 62 | Ht 72.0 in | Wt 261.0 lb

## 2023-01-20 DIAGNOSIS — G44219 Episodic tension-type headache, not intractable: Secondary | ICD-10-CM | POA: Diagnosis not present

## 2023-01-20 DIAGNOSIS — K602 Anal fissure, unspecified: Secondary | ICD-10-CM

## 2023-01-20 DIAGNOSIS — M25551 Pain in right hip: Secondary | ICD-10-CM

## 2023-01-20 DIAGNOSIS — G894 Chronic pain syndrome: Secondary | ICD-10-CM | POA: Insufficient documentation

## 2023-01-20 DIAGNOSIS — K5900 Constipation, unspecified: Secondary | ICD-10-CM | POA: Diagnosis not present

## 2023-01-20 DIAGNOSIS — I10 Essential (primary) hypertension: Secondary | ICD-10-CM

## 2023-01-20 DIAGNOSIS — Z9889 Other specified postprocedural states: Secondary | ICD-10-CM | POA: Insufficient documentation

## 2023-01-20 DIAGNOSIS — M25552 Pain in left hip: Secondary | ICD-10-CM

## 2023-01-20 DIAGNOSIS — M25562 Pain in left knee: Secondary | ICD-10-CM

## 2023-01-20 DIAGNOSIS — M25561 Pain in right knee: Secondary | ICD-10-CM

## 2023-01-20 DIAGNOSIS — F320 Major depressive disorder, single episode, mild: Secondary | ICD-10-CM

## 2023-01-20 DIAGNOSIS — G8929 Other chronic pain: Secondary | ICD-10-CM

## 2023-01-20 NOTE — Progress Notes (Signed)
Javier Joyce - 52 y.o. male MRN [id number]  Date of birth: 11/16/1970  Subjective Chief Complaint  Patient presents with   Weight Gain   Knee Pain   Insomnia   Rectal Problems    HPI Javier Joyce is a 52 y.o. male here today for follow-up visit.  History of hypertension.  This remains well-controlled with diltiazem and olmesartan with hydrochlorothiazide.  Doing well with these medications without side effects.  Blood pressure is well-controlled.  Has not had chest pain, shortness of breath, palpitations or vision changes.  Continues to deal with knee pain.  Original knee injuries during [employer].  Use meloxicam as needed.  This is helpful.  We have done intermittent injections as well.  He has had increased frequency of headaches.  These are mainly tension type headaches.  He is unable to exercise this frequently as he had been in the past.  Additionally he is not sleeping as well due to pain during the night.  He is now having some hip pain as well.  ROS:  A comprehensive ROS was completed and negative except as noted per HPI   Allergies  Allergen Reactions   Lisinopril Cough   Losartan Cough    Pt has lots of swelling if he does not have diuretic with this medication     Past Medical History:  Diagnosis Date   Anxiety    Arthritis    knees    Complication of anesthesia    Pt has woken up during the procedure   Depression    Hypertension    Sleep apnea    past hx     Past Surgical History:  Procedure Laterality Date   ACHILLES TENDON REPAIR Bilateral 2008   LAPAROSCOPIC GASTRIC SLEEVE RESECTION N/A 09/15/2017   Procedure: LAPAROSCOPIC GASTRIC SLEEVE RESECTION WITH UPPER ENDO AND ERAS PATHWAY;  Surgeon: Luretha Murphy, MD;  Location: [facility];  Service: General;  Laterality: N/A;   PATELLAR TENDON REPAIR Bilateral    right and left knee repair      Social History   Socioeconomic History   Marital status: Married    Spouse name: Not on file   Number  of children: Not on file   Years of education: Not on file   Highest education level: Not on file  Occupational History   Not on file  Tobacco Use   Smoking status: Former    Current packs/day: 0.00    Types: Cigarettes    Start date: 08/25/2015    Quit date: 08/24/2017    Years since quitting: 5.4   Smokeless tobacco: Never   Tobacco comments:    Social smoker  Vaping Use   Vaping status: Never Used  Substance and Sexual Activity   Alcohol use: Yes    Comment: occass   Drug use: Never   Sexual activity: Yes  Other Topics Concern   Not on file  Social History Narrative   Not on file   Social Determinants of Health   Financial Resource Strain: Not on file  Food Insecurity: No Food Insecurity (05/06/2017)   Hunger Vital Sign    Worried About Radiation protection practitioner of Food in the Last Year: Never true    Ran Out of Food in the Last Year: Never true  Transportation Needs: Not on file  Physical Activity: Not on file  Stress: Not on file  Social Connections: Not on file    Family History  Problem Relation Age of Onset   Other  Father        SB blockage, + colostomy, ? cancer    Colon cancer Neg Hx    Colon polyps Neg Hx    Esophageal cancer Neg Hx    Rectal cancer Neg Hx    Stomach cancer Neg Hx     Health Maintenance  Topic Date Due   Hepatitis C Screening  Never done   COVID-19 Vaccine (6 - 2023-24 season) 12/21/2022   INFLUENZA VACCINE  07/20/2023 (Originally 11/20/2022)   DTaP/Tdap/Td (2 - Td or Tdap) 09/23/2026   Colonoscopy  10/13/2029   HIV Screening  Completed   Zoster Vaccines- Shingrix  Completed   HPV VACCINES  Aged Out     ----------------------------------------------------------------------------------------------------------------------------------------------------------------------------------------------------------------- Physical Exam BP 113/72 (BP Location: Left Arm, Patient Position: Sitting, Cuff Size: Large)   Pulse 62   Ht 6' (1.829 m)   Wt 261 lb  (118.4 kg)   SpO2 99%   BMI 35.40 kg/m   Physical Exam Constitutional:      Appearance: Normal appearance.  Eyes:     General: No scleral icterus. Cardiovascular:     Rate and Rhythm: Normal rate and regular rhythm.  Pulmonary:     Effort: Pulmonary effort is normal.     Breath sounds: Normal breath sounds.  Musculoskeletal:     Cervical back: Neck supple.  Neurological:     General: No focal deficit present.     Mental Status: He is alert.  Psychiatric:        Mood and Affect: Mood normal.        Behavior: Behavior normal.     ------------------------------------------------------------------------------------------------------------------------------------------------------------------------------------------------------------------- Assessment and Plan  Episodic tension type headache Related to increased stress and inability to exercise as well as poor sleep related to his knee pain.  Currently using over-the-counter analgesics and/or meloxicam as needed.  MDD (major depressive disorder), single episode Stable with bupropion.  He will continue this at current strength.  Anal fissure Continues to have some anal pain.  Referral placed to GI.  Bilateral knee pain He has chronic bilateral knee pain.  This does affect his sleep patterns required him to change positions frequently while trying to sleep.  This has created tension headaches for him due to his poor sleep.  Continue meloxicam as needed.  Essential hypertension Blood pressure remains well controlled with current medications.  We will plan to continue.  Refills provided.   No orders of the defined types were placed in this encounter.   Return in about 6 months (around 07/21/2023) for Hypertension.    This visit occurred during the SARS-CoV-2 public health emergency.  Safety protocols were in place, including screening questions prior to the visit, additional usage of staff PPE, and extensive cleaning of exam  room while observing appropriate contact time as indicated for disinfecting solutions.

## 2023-01-24 ENCOUNTER — Ambulatory Visit: Payer: 59

## 2023-01-24 DIAGNOSIS — M25551 Pain in right hip: Secondary | ICD-10-CM

## 2023-01-24 DIAGNOSIS — M25552 Pain in left hip: Secondary | ICD-10-CM | POA: Diagnosis not present

## 2023-01-25 ENCOUNTER — Encounter: Payer: Self-pay | Admitting: Family Medicine

## 2023-01-25 NOTE — Assessment & Plan Note (Signed)
Related to increased stress and inability to exercise as well as poor sleep related to his knee pain.  Currently using over-the-counter analgesics and/or meloxicam as needed.

## 2023-01-25 NOTE — Assessment & Plan Note (Signed)
Blood pressure remains well controlled with current medications.  We will plan to continue.  Refills provided.

## 2023-01-25 NOTE — Assessment & Plan Note (Addendum)
Stable with bupropion.  He will continue this at current strength.

## 2023-01-25 NOTE — Assessment & Plan Note (Signed)
He has chronic bilateral knee pain.  This does affect his sleep patterns required him to change positions frequently while trying to sleep.  This has created tension headaches for him due to his poor sleep.  Continue meloxicam as needed.

## 2023-01-25 NOTE — Assessment & Plan Note (Signed)
Continues to have some anal pain.  Referral placed to GI.

## 2023-03-17 ENCOUNTER — Ambulatory Visit: Payer: 59 | Admitting: Gastroenterology

## 2023-03-17 NOTE — Progress Notes (Deleted)
Wyline Mood MD, MRCP(U.K) [address]  [address]  [place], [place] [address]  Main: [phone]  Fax: [phone]   Gastroenterology Consultation  Referring Provider:     Everrett Coombe, DO Primary Care Physician:  Everrett Coombe, DO Primary Gastroenterologist:  Dr. Wyline Mood  Reason for Consultation:    anal fissure         HPI:   Javier Joyce is a 52 y.o. y/o male referred for consultation & management  by Dr. Ashley Royalty, Selena Batten, DO.    Panya  10/14/2019: Colonoscopy : Diverticulosis, internal hemorrhoids and small polyps resected in recto sigmoid region. Polyps were hyperplastic.      Past Medical History:  Diagnosis Date   Anxiety    Arthritis    knees    Complication of anesthesia    Pt has woken up during the procedure   Depression    Hypertension    Sleep apnea    past hx     Past Surgical History:  Procedure Laterality Date   ACHILLES TENDON REPAIR Bilateral 2008   LAPAROSCOPIC GASTRIC SLEEVE RESECTION N/A 09/15/2017   Procedure: LAPAROSCOPIC GASTRIC SLEEVE RESECTION WITH UPPER ENDO AND ERAS PATHWAY;  Surgeon: Luretha Murphy, MD;  Location: [facility];  Service: General;  Laterality: N/A;   PATELLAR TENDON REPAIR Bilateral    right and left knee repair      Prior to Admission medications   Medication Sig Start Date End Date Taking? Authorizing Provider  amitriptyline (ELAVIL) 25 MG tablet Take 1/2 tab x10 days then increase to full tab 12/17/21   Everrett Coombe, DO  buPROPion (WELLBUTRIN XL) 300 MG 24 hr tablet Take 1 tablet (300 mg total) by mouth daily. 08/08/21   Everrett Coombe, DO  diclofenac (VOLTAREN) 75 MG EC tablet Take 75 mg by mouth 2 (two) times daily. 12/09/22   [provider]  diltiazem (TIAZAC) 120 MG 24 hr capsule  10/29/18   [provider]  ketotifen (ZADITOR) 0.025 % ophthalmic solution Place 1 drop into both eyes every 4 (four) hours as needed (itchy eyes). 08/29/19   Everrett Coombe, DO  lidocaine (XYLOCAINE) 5 %  ointment Apply 1 Application topically 4 (four) times daily as needed. 12/17/21   Everrett Coombe, DO  meloxicam (MOBIC) 15 MG tablet Take 1 tablet (15 mg total) by mouth daily as needed. for pain 06/23/22   Everrett Coombe, DO  olmesartan-hydrochlorothiazide (BENICAR HCT) 20-12.5 MG tablet Take 1 tablet by mouth daily. 06/23/22   Everrett Coombe, DO  sildenafil (VIAGRA) 100 MG tablet TAKE 1 TABLET BY MOUTH ONCE DAILY AS NEEDED FOR ERECTILE DYSFUNCTION 08/15/22   Everrett Coombe, DO  tamsulosin (FLOMAX) 0.4 MG CAPS capsule Take 1 capsule (0.4 mg total) by mouth daily. 06/23/22   Everrett Coombe, DO  triazolam (HALCION) 0.125 MG tablet Take 1 hour prior to MRI 07/09/22   Everrett Coombe, DO  Vitamin D, Ergocalciferol, (DRISDOL) 1.25 MG (50000 UNIT) CAPS capsule Take 1 capsule (50,000 Units total) by mouth every 7 (seven) days. 06/24/22   Everrett Coombe, DO    Family History  Problem Relation Age of Onset   Other Father        SB blockage, + colostomy, ? cancer    Colon cancer Neg Hx    Colon polyps Neg Hx    Esophageal cancer Neg Hx    Rectal cancer Neg Hx    Stomach cancer Neg Hx      Social History   Tobacco Use  Smoking status: Former    Current packs/day: 0.00    Types: Cigarettes    Start date: 08/25/2015    Quit date: 08/24/2017    Years since quitting: 5.5   Smokeless tobacco: Never   Tobacco comments:    Social smoker  Vaping Use   Vaping status: Never Used  Substance Use Topics   Alcohol use: Yes    Comment: occass   Drug use: Never    Allergies as of 03/17/2023 - Review Complete 01/25/2023  Allergen Reaction Noted   Lisinopril Cough 08/18/2017   Losartan Cough 09/15/2017    Review of Systems:    All systems reviewed and negative except where noted in HPI.   Physical Exam:  There were no vitals taken for this visit. No LMP for male patient. Psych:  Alert and cooperative. Normal mood and affect. General:   Alert,  Well-developed, well-nourished, pleasant and cooperative in  NAD Head:  Normocephalic and atraumatic. Eyes:  Sclera clear, no icterus.   Conjunctiva pink. Ears:  Normal auditory acuity. Neck:  Supple; no masses or thyromegaly. Lungs:  Respirations even and unlabored.  Clear throughout to auscultation.   No wheezes, crackles, or rhonchi. No acute distress. Heart:  Regular rate and rhythm; no murmurs, clicks, rubs, or gallops. Abdomen:  Normal bowel sounds.  No bruits.  Soft, non-tender and non-distended without masses, hepatosplenomegaly or hernias noted.  No guarding or rebound tenderness.    Neurologic:  Alert and oriented x3;  grossly normal neurologically. Psych:  Alert and cooperative. Normal mood and affect.  Imaging Studies: No results found.  Assessment and Plan:   Javier Joyce is a 52 y.o. y/o male has been referred for anal fissure.   Follow up in ***  Dr Wyline Mood MD,MRCP(U.K)

## 2023-04-09 ENCOUNTER — Encounter (HOSPITAL_COMMUNITY): Payer: Self-pay | Admitting: *Deleted

## 2023-04-30 ENCOUNTER — Encounter: Payer: Self-pay | Admitting: Family Medicine

## 2023-04-30 ENCOUNTER — Ambulatory Visit: Payer: 59 | Admitting: Family Medicine

## 2023-04-30 MED ORDER — ZEPBOUND 10 MG/0.5ML ~~LOC~~ SOAJ: 10.0000 mg | SUBCUTANEOUS | 0 refills | Status: DC

## 2023-04-30 MED ORDER — ZEPBOUND 5 MG/0.5ML ~~LOC~~ SOAJ: 5.0000 mg | SUBCUTANEOUS | 0 refills | Status: DC

## 2023-04-30 MED ORDER — ZEPBOUND 2.5 MG/0.5ML ~~LOC~~ SOAJ: 2.5000 mg | SUBCUTANEOUS | 0 refills | Status: DC

## 2023-04-30 NOTE — Assessment & Plan Note (Signed)
 Status post gastric sleeve now with some regain of his weight.  I do think he would benefit from GLP-1 to help with weight management.  Adding Zepbound  with titration of the next few months.  Insurance does not cover this we will proceed with compounded semaglutide

## 2023-04-30 NOTE — Progress Notes (Signed)
 As well as.  This has been used Javier Joyce - 53 y.o. male MRN [id number]  Date of birth: 08/18/1970  Subjective Chief Complaint  Patient presents with   Weight Loss    HPI Javier Joyce is a 53 y.o. male here today to discuss weight management.  He had some difficulty with weight loss.  He does have history of laparoscopic sleeve with good weight loss initially but has gained some weight back.  He is interested in trying GLP-1.  He has tried working on diet and activity level.  He is not sure if his insurance covers GLP-1 medication however some of his coworkers do use these.  His allergies compounded semaglutide as well which she may be interested in if his insurance does not cover commercially available preparations.  ROS:  A comprehensive ROS was completed and negative except as noted per HPI  Allergies  Allergen Reactions   Lisinopril  Cough   Losartan  Cough    Pt has lots of swelling if he does not have diuretic with this medication    Ciprofloxacin Itching   Keflex [Cephalexin] Itching    Past Medical History:  Diagnosis Date   Anxiety    Arthritis    knees    Complication of anesthesia    Pt has woken up during the procedure   Depression    Hypertension    Sleep apnea    past hx     Past Surgical History:  Procedure Laterality Date   ACHILLES TENDON REPAIR Bilateral 2008   LAPAROSCOPIC GASTRIC SLEEVE RESECTION N/A 09/15/2017   Procedure: LAPAROSCOPIC GASTRIC SLEEVE RESECTION WITH UPPER ENDO AND ERAS PATHWAY;  Surgeon: Gladis Cough, MD;  Location: [facility];  Service: General;  Laterality: N/A;   PATELLAR TENDON REPAIR Bilateral    right and left knee repair      Social History   Socioeconomic History   Marital status: Married    Spouse name: Not on file   Number of children: Not on file   Years of education: Not on file   Highest education level: Not on file  Occupational History   Not on file  Tobacco Use   Smoking status: Former    Current  packs/day: 0.00    Types: Cigarettes    Start date: 08/25/2015    Quit date: 08/24/2017    Years since quitting: 5.6   Smokeless tobacco: Never   Tobacco comments:    Social smoker  Vaping Use   Vaping status: Never Used  Substance and Sexual Activity   Alcohol use: Yes    Comment: occass   Drug use: Never   Sexual activity: Yes  Other Topics Concern   Not on file  Social History Narrative   Not on file   Social Drivers of Health   Financial Resource Strain: Not on file  Food Insecurity: No Food Insecurity (05/06/2017)   Hunger Vital Sign    Worried About Programme Researcher, Broadcasting/film/video in the Last Year: Never true    Ran Out of Food in the Last Year: Never true  Transportation Needs: Not on file  Physical Activity: Not on file  Stress: Not on file  Social Connections: Not on file    Family History  Problem Relation Age of Onset   Other Father        SB blockage, + colostomy, ? cancer    Colon cancer Neg Hx    Colon polyps Neg Hx    Esophageal cancer Neg Hx  Rectal cancer Neg Hx    Stomach cancer Neg Hx     Health Maintenance  Topic Date Due   Hepatitis C Screening  Never done   COVID-19 Vaccine (6 - 2024-25 season) 12/21/2022   INFLUENZA VACCINE  07/20/2023 (Originally 11/20/2022)   Colonoscopy  10/13/2029   DTaP/Tdap/Td (3 - Td or Tdap) 03/31/2033   HIV Screening  Completed   Zoster Vaccines- Shingrix  Completed   HPV VACCINES  Aged Out     ----------------------------------------------------------------------------------------------------------------------------------------------------------------------------------------------------------------- Physical Exam BP 109/69 (BP Location: Left Arm, Patient Position: Sitting, Cuff Size: Large)   Pulse 64   Ht 6' (1.829 m)   Wt 260 lb (117.9 kg)   SpO2 97%   BMI 35.26 kg/m   Physical Exam Constitutional:      Appearance: Normal appearance.  HENT:     Head: Normocephalic and atraumatic.  Cardiovascular:     Rate and  Rhythm: Normal rate and regular rhythm.  Pulmonary:     Effort: Pulmonary effort is normal.     Breath sounds: Normal breath sounds.  Neurological:     General: No focal deficit present.     Mental Status: He is alert.  Psychiatric:        Mood and Affect: Mood normal.        Behavior: Behavior normal.     ------------------------------------------------------------------------------------------------------------------------------------------------------------------------------------------------------------------- Assessment and Plan  Morbid obesity (HCC) Status post gastric sleeve now with some regain of his weight.  I do think he would benefit from GLP-1 to help with weight management.  Adding Zepbound  with titration of the next few months.  Insurance does not cover this we will proceed with compounded semaglutide   Meds ordered this encounter  Medications   tirzepatide  (ZEPBOUND ) 2.5 MG/0.5ML Pen    Sig: Inject 2.5 mg into the skin once a week.    Dispense:  2 mL    Refill:  0   tirzepatide  (ZEPBOUND ) 5 MG/0.5ML Pen    Sig: Inject 5 mg into the skin once a week.    Dispense:  2 mL    Refill:  0   tirzepatide  (ZEPBOUND ) 10 MG/0.5ML Pen    Sig: Inject 10 mg into the skin once a week.    Dispense:  2 mL    Refill:  0    No follow-ups on file.    This visit occurred during the SARS-CoV-2 public health emergency.  Safety protocols were in place, including screening questions prior to the visit, additional usage of staff PPE, and extensive cleaning of exam room while observing appropriate contact time as indicated for disinfecting solutions.

## 2023-05-04 ENCOUNTER — Ambulatory Visit: Payer: 59 | Admitting: Family Medicine

## 2023-06-20 ENCOUNTER — Telehealth: Payer: Self-pay

## 2023-06-20 NOTE — Telephone Encounter (Signed)
 Prior auth for: ZEPBOUND 2.5 MG Determination: Cannot find matching patient with Name and Date of Birth provided. Please make sure the patient's name and date of birth are spelled and formatted exactly as shown on the [employer] card. Auth #: [id number] Valid from: N/A

## 2023-06-28 ENCOUNTER — Other Ambulatory Visit: Payer: Self-pay | Admitting: Family Medicine

## 2023-06-28 ENCOUNTER — Encounter: Payer: Self-pay | Admitting: Family Medicine

## 2023-07-23 ENCOUNTER — Ambulatory Visit: Payer: 59 | Admitting: Family Medicine

## 2023-07-23 ENCOUNTER — Encounter: Payer: Self-pay | Admitting: Family Medicine

## 2023-07-23 VITALS — BP 122/80 | HR 69 | Ht 72.0 in | Wt 263.0 lb

## 2023-07-23 DIAGNOSIS — M25561 Pain in right knee: Secondary | ICD-10-CM | POA: Diagnosis not present

## 2023-07-23 DIAGNOSIS — I10 Essential (primary) hypertension: Secondary | ICD-10-CM

## 2023-07-23 DIAGNOSIS — M25562 Pain in left knee: Secondary | ICD-10-CM

## 2023-07-23 DIAGNOSIS — H524 Presbyopia: Secondary | ICD-10-CM | POA: Insufficient documentation

## 2023-07-23 DIAGNOSIS — G8929 Other chronic pain: Secondary | ICD-10-CM

## 2023-07-23 DIAGNOSIS — R0789 Other chest pain: Secondary | ICD-10-CM | POA: Insufficient documentation

## 2023-07-23 MED ORDER — TIRZEPATIDE 10 MG/0.5ML ~~LOC~~ SOAJ: SUBCUTANEOUS | 4 refills | Status: AC

## 2023-07-26 NOTE — Progress Notes (Signed)
 Javier Joyce - 53 y.o. male MRN [id number]  Date of birth: 09-Nov-1970  Subjective Chief Complaint  Patient presents with   Hypertension   Carpal Tunnel    HPI Javier Joyce is a 53 y.o. male here today for follow up.    Overall doing pretty well.  He is at the point where his wrists are bothering him more and would like to discuss injection for carpal tunnel.  He is also interested in having his knees be injected.  Is been a few years since he has had this done.  Last injections worked pretty well for him.  He is also interested in adding medication to help with weight management.  He is status post laparoscopic gastric sleeve resection in 2019 and had good weight loss with this.  Over the past year or so he is started to put weight back on.  Current weight is 263 pounds. He is interested in trying GLP-1 medication but does not think insurance covers this.   ROS:  A comprehensive ROS was completed and negative except as noted per HPI  Allergies  Allergen Reactions   Lisinopril Cough   Losartan Cough    Pt has lots of swelling if he does not have diuretic with this medication    Ciprofloxacin Itching   Keflex [Cephalexin] Itching    Past Medical History:  Diagnosis Date   Anxiety    Arthritis    knees    Complication of anesthesia    Pt has woken up during the procedure   Depression    Hypertension    Sleep apnea    past hx     Past Surgical History:  Procedure Laterality Date   ACHILLES TENDON REPAIR Bilateral 2008   LAPAROSCOPIC GASTRIC SLEEVE RESECTION N/A 09/15/2017   Procedure: LAPAROSCOPIC GASTRIC SLEEVE RESECTION WITH UPPER ENDO AND ERAS PATHWAY;  Surgeon: Luretha Murphy, MD;  Location: [facility];  Service: General;  Laterality: N/A;   PATELLAR TENDON REPAIR Bilateral    right and left knee repair      Social History   Socioeconomic History   Marital status: Married    Spouse name: Not on file   Number of children: Not on file   Years of education: Not  on file   Highest education level: Not on file  Occupational History   Not on file  Tobacco Use   Smoking status: Former    Current packs/day: 0.00    Types: Cigarettes    Start date: 08/25/2015    Quit date: 08/24/2017    Years since quitting: 5.9   Smokeless tobacco: Never   Tobacco comments:    Social smoker  Vaping Use   Vaping status: Never Used  Substance and Sexual Activity   Alcohol use: Yes    Comment: occass   Drug use: Never   Sexual activity: Yes  Other Topics Concern   Not on file  Social History Narrative   Not on file   Social Drivers of Health   Financial Resource Strain: Not on file  Food Insecurity: No Food Insecurity (05/06/2017)   Hunger Vital Sign    Worried About Running Out of Food in the Last Year: Never true    Ran Out of Food in the Last Year: Never true  Transportation Needs: Not on file  Physical Activity: Not on file  Stress: Not on file  Social Connections: Not on file    Family History  Problem Relation Age of Onset  Other Father        SB blockage, + colostomy, ? cancer    Colon cancer Neg Hx    Colon polyps Neg Hx    Esophageal cancer Neg Hx    Rectal cancer Neg Hx    Stomach cancer Neg Hx     Health Maintenance  Topic Date Due   Hepatitis C Screening  Never done   COVID-19 Vaccine (6 - 2024-25 season) 12/21/2022   INFLUENZA VACCINE  11/20/2023   Colonoscopy  10/13/2029   DTaP/Tdap/Td (3 - Td or Tdap) 03/31/2033   HIV Screening  Completed   Zoster Vaccines- Shingrix  Completed   HPV VACCINES  Aged Out     ----------------------------------------------------------------------------------------------------------------------------------------------------------------------------------------------------------------- Physical Exam BP 122/80 (BP Location: Left Arm, Patient Position: Sitting, Cuff Size: Large)   Pulse 69   Ht 6' (1.829 m)   Wt 263 lb (119.3 kg)   SpO2 97%   BMI 35.67 kg/m   Physical Exam Constitutional:       Appearance: Normal appearance.  HENT:     Head: Normocephalic and atraumatic.  Eyes:     General: No scleral icterus. Cardiovascular:     Rate and Rhythm: Normal rate and regular rhythm.  Pulmonary:     Effort: Pulmonary effort is normal.     Breath sounds: Normal breath sounds.  Neurological:     Mental Status: He is alert.  Psychiatric:        Mood and Affect: Mood normal.        Behavior: Behavior normal.     ------------------------------------------------------------------------------------------------------------------------------------------------------------------------------------------------------------------- Assessment and Plan  Essential hypertension Blood pressure remains well controlled with current medications.  We will plan to continue.  Refills provided.  Bilateral knee pain He has benefited from steroid injections in the past and we can repeat injection however Orthovisc may provide more benefit for him and I encouraged him to discuss this with Dr. Benjamin Stain at visit to discuss carpal tunnel.  Morbid obesity (HCC) Adding injectable to his appetite once weekly starting at 0.25 mg x 4 weeks then increase as tolerated based on response.  Side effects reviewed.   Meds ordered this encounter  Medications   tirzepatide (MOUNJARO) 10 MG/0.5ML Pen    Sig: Liposlim.  Tirzepatide/Pyridoxine/Thiamine/L-Carnitine 10mg /mL.  Inject 2.5 mg/25 units subcu weekly for 4 weeks then 5 mg/50 units subcu weekly for 4 weeks then 7.5 mg/75 units subcu weekly for 4 weeks then 10 mg/100 units subcu weekly for 4 weeks then 15 mg/150 units subcu weekly    Dispense:  3 mL    Refill:  4    No follow-ups on file.

## 2023-07-26 NOTE — Assessment & Plan Note (Signed)
 He has benefited from steroid injections in the past and we can repeat injection however Orthovisc may provide more benefit for him and I encouraged him to discuss this with Dr. Benjamin Stain at visit to discuss carpal tunnel.

## 2023-07-26 NOTE — Assessment & Plan Note (Signed)
 Adding injectable to his appetite once weekly starting at 0.25 mg x 4 weeks then increase as tolerated based on response.  Side effects reviewed.

## 2023-07-26 NOTE — Assessment & Plan Note (Signed)
Blood pressure remains well controlled with current medications.  We will plan to continue.  Refills provided.

## 2023-08-19 ENCOUNTER — Encounter: Payer: Self-pay | Admitting: Family Medicine

## 2023-08-19 ENCOUNTER — Ambulatory Visit: Admitting: Family Medicine

## 2023-08-19 VITALS — BP 126/82 | HR 70 | Ht 72.0 in | Wt 261.0 lb

## 2023-08-19 DIAGNOSIS — M170 Bilateral primary osteoarthritis of knee: Secondary | ICD-10-CM | POA: Diagnosis not present

## 2023-08-19 NOTE — Assessment & Plan Note (Signed)
 Continues to have bilateral knee pain.  Will plan to do steroid injection at next appt.  Referral to visco discussion with Dr. Sandy Crumb if not improved with steroid injection.

## 2023-08-19 NOTE — Progress Notes (Signed)
 Javier Joyce - 53 y.o. male MRN [id number]  Date of birth: 09/27/1970  Subjective Chief Complaint  Patient presents with   Joint Swelling    HPI Javier Joyce is a 53 y.o. male here today for follow up of knee pain.  He has history of OA in b/l knees.  He has had corticosteroid injections in the past that worked pretty well for him.  It has been several years since he has had one of these.  He is interested in injection again but isn't sure that he is quite ready to have this done.  He is trying to stay active.  His knee pain does affect his sleep.  Poor sleep tends to trigger more headaches for him as well.  He does continue on meloxicam  as needed.  ROS:  A comprehensive ROS was completed and negative except as noted per HPI    Allergies  Allergen Reactions   Lisinopril  Cough   Losartan  Cough    Pt has lots of swelling if he does not have diuretic with this medication    Ciprofloxacin Itching   Keflex [Cephalexin] Itching    Past Medical History:  Diagnosis Date   Anxiety    Arthritis    knees    Complication of anesthesia    Pt has woken up during the procedure   Depression    Hypertension    Sleep apnea    past hx     Past Surgical History:  Procedure Laterality Date   ACHILLES TENDON REPAIR Bilateral 2008   LAPAROSCOPIC GASTRIC SLEEVE RESECTION N/A 09/15/2017   Procedure: LAPAROSCOPIC GASTRIC SLEEVE RESECTION WITH UPPER ENDO AND ERAS PATHWAY;  Surgeon: Jacolyn Matar, MD;  Location: [facility];  Service: General;  Laterality: N/A;   PATELLAR TENDON REPAIR Bilateral    right and left knee repair      Social History   Socioeconomic History   Marital status: Married    Spouse name: Not on file   Number of children: Not on file   Years of education: Not on file   Highest education level: Not on file  Occupational History   Not on file  Tobacco Use   Smoking status: Former    Current packs/day: 0.00    Types: Cigarettes    Start date: 08/25/2015    Quit  date: 08/24/2017    Years since quitting: 5.9   Smokeless tobacco: Never   Tobacco comments:    Social smoker  Vaping Use   Vaping status: Never Used  Substance and Sexual Activity   Alcohol use: Yes    Comment: occass   Drug use: Never   Sexual activity: Yes  Other Topics Concern   Not on file  Social History Narrative   Not on file   Social Drivers of Health   Financial Resource Strain: Not on file  Food Insecurity: No Food Insecurity (05/06/2017)   Hunger Vital Sign    Worried About Running Out of Food in the Last Year: Never true    Ran Out of Food in the Last Year: Never true  Transportation Needs: Not on file  Physical Activity: Not on file  Stress: Not on file  Social Connections: Not on file    Family History  Problem Relation Age of Onset   Other Father        SB blockage, + colostomy, ? cancer    Colon cancer Neg Hx    Colon polyps Neg Hx    Esophageal cancer  Neg Hx    Rectal cancer Neg Hx    Stomach cancer Neg Hx     Health Maintenance  Topic Date Due   Hepatitis C Screening  Never done   COVID-19 Vaccine (6 - 2024-25 season) 12/21/2022   INFLUENZA VACCINE  11/20/2023   Colonoscopy  10/13/2029   DTaP/Tdap/Td (3 - Td or Tdap) 03/31/2033   HIV Screening  Completed   Zoster Vaccines- Shingrix  Completed   HPV VACCINES  Aged Out   Meningococcal B Vaccine  Aged Out     ----------------------------------------------------------------------------------------------------------------------------------------------------------------------------------------------------------------- Physical Exam BP 126/82 (BP Location: Right Arm, Patient Position: Sitting, Cuff Size: Large)   Pulse 70   Ht 6' (1.829 m)   Wt 261 lb (118.4 kg)   SpO2 97%   BMI 35.40 kg/m   Physical Exam Constitutional:      Appearance: Normal appearance.  Cardiovascular:     Rate and Rhythm: Normal rate and regular rhythm.  Musculoskeletal:     Comments: L knee with small effusion. ROM  with pain on full extension bilaterally.  Crepitus noted.   Neurological:     Mental Status: He is alert.  Psychiatric:        Mood and Affect: Mood normal.        Behavior: Behavior normal.     ------------------------------------------------------------------------------------------------------------------------------------------------------------------------------------------------------------------- Assessment and Plan  Bilateral primary osteoarthritis of knee Continues to have bilateral knee pain.  Will plan to do steroid injection at next appt.  Referral to visco discussion with Dr. Sandy Crumb if not improved with steroid injection.     No orders of the defined types were placed in this encounter.   No follow-ups on file.

## 2023-09-03 ENCOUNTER — Ambulatory Visit: Admitting: Sports Medicine

## 2023-09-03 DIAGNOSIS — G5603 Carpal tunnel syndrome, bilateral upper limbs: Secondary | ICD-10-CM | POA: Diagnosis not present

## 2023-09-03 NOTE — Assessment & Plan Note (Signed)
 Very pleasant 53 year old male, increasing numbness and tingling both hands sparing the pinky worse at night. On exam he has positive bilateral Tinel's and Phalen signs for We discussed the anatomy and pathophysiology of carpal tunnel syndrome. We will start with nocturnal splinting, home physical therapy, sodium avoidance. Return to see me in 6 weeks, injection if not better.

## 2023-09-03 NOTE — Patient Instructions (Signed)
 Javier Joyce

## 2023-09-03 NOTE — Progress Notes (Signed)
    Procedures performed today:    None.  Independent interpretation of notes and tests performed by another provider:   None.  Brief History, Exam, Impression, and Recommendations:    CTS (carpal tunnel syndrome) Very pleasant 53 year old male, increasing numbness and tingling both hands sparing the pinky worse at night. On exam he has positive bilateral Tinel's and Phalen signs for We discussed the anatomy and pathophysiology of carpal tunnel syndrome. We will start with nocturnal splinting, home physical therapy, sodium avoidance. Return to see me in 6 weeks, injection if not better.    ____________________________________________ Joselyn Nicely. Sandy Crumb, M.D., ABFM., CAQSM., AME. Primary Care and Sports Medicine [facility]  Adjunct Professor of [facility] Medicine  [employer]  Restaurant manager, fast food

## 2023-10-15 ENCOUNTER — Ambulatory Visit: Admitting: Sports Medicine

## 2023-10-19 ENCOUNTER — Ambulatory Visit: Admitting: Family Medicine

## 2023-10-20 ENCOUNTER — Ambulatory Visit: Admitting: Family Medicine

## 2023-10-20 ENCOUNTER — Encounter: Payer: Self-pay | Admitting: Family Medicine

## 2023-10-20 VITALS — BP 148/93 | HR 52 | Ht 72.0 in | Wt 250.0 lb

## 2023-10-20 DIAGNOSIS — M25561 Pain in right knee: Secondary | ICD-10-CM

## 2023-10-20 DIAGNOSIS — G8929 Other chronic pain: Secondary | ICD-10-CM

## 2023-10-20 DIAGNOSIS — M25562 Pain in left knee: Secondary | ICD-10-CM | POA: Diagnosis not present

## 2023-10-20 NOTE — Assessment & Plan Note (Signed)
 Bilateral knee injection completed today.  See procedure note.

## 2023-10-20 NOTE — Progress Notes (Signed)
 Javier Joyce - 53 y.o. male MRN [id number]  Date of birth: [birth year]  Subjective Chief Complaint  Patient presents with   Hypertension    HPI Javier Joyce is a 53 y.o. male here today for follow up of bilateral knee pain.  History of b/l OA.  Previous surgery for patellar tendon repair x2 during [employer].  He has had injections previously which have worked well for him.  He would like to have repeat injection today.  Last injection was helpful for a couple of years.   ROS:  A comprehensive ROS was completed and negative except as noted per HPI  Allergies  Allergen Reactions   Lisinopril  Cough   Losartan  Cough    Pt has lots of swelling if he does not have diuretic with this medication    Ciprofloxacin Itching   Keflex [Cephalexin] Itching    Past Medical History:  Diagnosis Date   Anxiety    Arthritis    knees    Complication of anesthesia    Pt has woken up during the procedure   Depression    Hypertension    Sleep apnea    past hx     Past Surgical History:  Procedure Laterality Date   ACHILLES TENDON REPAIR Bilateral 2008   LAPAROSCOPIC GASTRIC SLEEVE RESECTION N/A 09/15/2017   Procedure: LAPAROSCOPIC GASTRIC SLEEVE RESECTION WITH UPPER ENDO AND ERAS PATHWAY;  Surgeon: Gladis Cough, MD;  Location: [facility];  Service: General;  Laterality: N/A;   PATELLAR TENDON REPAIR Bilateral    right and left knee repair      Social History   Socioeconomic History   Marital status: Married    Spouse name: Not on file   Number of children: Not on file   Years of education: Not on file   Highest education level: Not on file  Occupational History   Not on file  Tobacco Use   Smoking status: Former    Current packs/day: 0.00    Types: Cigarettes    Start date: 08/25/2015    Quit date: 08/24/2017    Years since quitting: 6.1   Smokeless tobacco: Never   Tobacco comments:    Social smoker  Vaping Use   Vaping status: Never Used  Substance and Sexual  Activity   Alcohol use: Yes    Comment: occass   Drug use: Never   Sexual activity: Yes  Other Topics Concern   Not on file  Social History Narrative   Not on file   Social Drivers of Health   Financial Resource Strain: Not on file  Food Insecurity: No Food Insecurity (05/06/2017)   Hunger Vital Sign    Worried About Programme researcher, broadcasting/film/video in the Last Year: Never true    Ran Out of Food in the Last Year: Never true  Transportation Needs: Not on file  Physical Activity: Not on file  Stress: Not on file  Social Connections: Not on file    Family History  Problem Relation Age of Onset   Other Father        SB blockage, + colostomy, ? cancer    Colon cancer Neg Hx    Colon polyps Neg Hx    Esophageal cancer Neg Hx    Rectal cancer Neg Hx    Stomach cancer Neg Hx     Health Maintenance  Topic Date Due   Hepatitis C Screening  Never done   Hepatitis B Vaccines (1 of 3 - 19+ 3-dose  series) Never done   COVID-19 Vaccine (5 - 2024-25 season) 12/21/2022   INFLUENZA VACCINE  11/20/2023   Colonoscopy  10/13/2029   DTaP/Tdap/Td (3 - Td or Tdap) 03/31/2033   HIV Screening  Completed   Zoster Vaccines- Shingrix  Completed   HPV VACCINES  Aged Out   Meningococcal B Vaccine  Aged Out     ----------------------------------------------------------------------------------------------------------------------------------------------------------------------------------------------------------------- Physical Exam BP (!) 148/93 (BP Location: Left Arm, Patient Position: Sitting, Cuff Size: Large)   Pulse (!) 52   Ht 6' (1.829 m)   Wt 250 lb (113.4 kg)   SpO2 100%   BMI 33.91 kg/m   Physical Exam Constitutional:      Appearance: Normal appearance.  Musculoskeletal:     Comments: Knees with tenderness along medial joint line bilaterally.  No effusion noted.  Okay  Neurological:     Mental Status: He is alert.    Procedure note: Procedure discussed with patient and consent given.   Right knee prepped in typical sterile fashion with chlorhexidine .  Cold spray applied.  Knee is injected using medial approach with 1 mL of 40 mg/mL triamcinolone and 4 mL of 1% lidocaine .  Band-Aid applied.  He tolerated procedure well.  Left knee prepped and injected in the same fashion.  He tolerated this well as well.  No immediate complications.   ------------------------------------------------------------------------------------------------------------------------------------------------------------------------------------------------------------------- Assessment and Plan  Bilateral knee pain Bilateral knee injection completed today.  See procedure note.   No orders of the defined types were placed in this encounter.   No follow-ups on file.

## 2023-11-02 ENCOUNTER — Ambulatory Visit: Admitting: Sports Medicine

## 2023-12-22 ENCOUNTER — Encounter: Payer: Self-pay | Admitting: Sports Medicine

## 2024-01-12 ENCOUNTER — Ambulatory Visit: Admitting: Family Medicine

## 2024-01-12 ENCOUNTER — Encounter: Payer: Self-pay | Admitting: Family Medicine

## 2024-01-12 VITALS — BP 138/84 | HR 61 | Ht 72.0 in | Wt 240.0 lb

## 2024-01-12 DIAGNOSIS — Z129 Encounter for screening for malignant neoplasm, site unspecified: Secondary | ICD-10-CM

## 2024-01-12 DIAGNOSIS — N5201 Erectile dysfunction due to arterial insufficiency: Secondary | ICD-10-CM

## 2024-01-12 DIAGNOSIS — I10 Essential (primary) hypertension: Secondary | ICD-10-CM | POA: Diagnosis not present

## 2024-01-12 DIAGNOSIS — Z9884 Bariatric surgery status: Secondary | ICD-10-CM

## 2024-01-12 DIAGNOSIS — R339 Retention of urine, unspecified: Secondary | ICD-10-CM

## 2024-01-12 DIAGNOSIS — Z125 Encounter for screening for malignant neoplasm of prostate: Secondary | ICD-10-CM | POA: Diagnosis not present

## 2024-01-12 DIAGNOSIS — R4189 Other symptoms and signs involving cognitive functions and awareness: Secondary | ICD-10-CM

## 2024-01-12 MED ORDER — OLMESARTAN MEDOXOMIL-HCTZ 20-12.5 MG PO TABS: 1.0000 | ORAL_TABLET | Freq: Every day | ORAL | 1 refills | Status: AC

## 2024-01-12 MED ORDER — SILDENAFIL CITRATE 100 MG PO TABS
100.0000 mg | ORAL_TABLET | Freq: Every day | ORAL | 0 refills | Status: AC | PRN
Start: 2024-01-12 — End: ?

## 2024-01-12 MED ORDER — TAMSULOSIN HCL 0.4 MG PO CAPS
0.4000 mg | ORAL_CAPSULE | Freq: Every day | ORAL | 3 refills | Status: AC
Start: 2024-01-12 — End: ?

## 2024-01-12 NOTE — Progress Notes (Signed)
 Javier Joyce - 53 y.o. male MRN [id number]  Date of birth: [birth year]  Subjective Chief Complaint  Patient presents with   Cancer    HPI Javier Joyce is a 53 y.o. male here today to discuss cancer screenings.  He reports that several family members have been diagnosed with different types of cancers including prostate, liver and brain cancer.  He denies any symptoms related to this at this time.  He has follow-up for additional service related  to the [place] related to his hypertension.   ROS:  A comprehensive ROS was completed and negative except as noted per HPI  Allergies  Allergen Reactions   Lisinopril  Cough   Losartan  Cough    Pt has lots of swelling if he does not have diuretic with this medication    Ciprofloxacin Itching   Keflex [Cephalexin] Itching    Past Medical History:  Diagnosis Date   Anxiety    Arthritis    knees    Complication of anesthesia    Pt has woken up during the procedure   Depression    Hypertension    Sleep apnea    past hx     Past Surgical History:  Procedure Laterality Date   ACHILLES TENDON REPAIR Bilateral 2008   LAPAROSCOPIC GASTRIC SLEEVE RESECTION N/A 09/15/2017   Procedure: LAPAROSCOPIC GASTRIC SLEEVE RESECTION WITH UPPER ENDO AND ERAS PATHWAY;  Surgeon: Gladis Cough, MD;  Location: [facility];  Service: General;  Laterality: N/A;   PATELLAR TENDON REPAIR Bilateral    right and left knee repair      Social History   Socioeconomic History   Marital status: Married    Spouse name: Not on file   Number of children: Not on file   Years of education: Not on file   Highest education level: Not on file  Occupational History   Not on file  Tobacco Use   Smoking status: Former    Current packs/day: 0.00    Types: Cigarettes    Start date: 08/25/2015    Quit date: 08/24/2017    Years since quitting: 6.4   Smokeless tobacco: Never   Tobacco comments:    Social smoker  Vaping Use   Vaping status: Never Used  Substance and  Sexual Activity   Alcohol use: Yes    Comment: occass   Drug use: Never   Sexual activity: Yes  Other Topics Concern   Not on file  Social History Narrative   Not on file   Social Drivers of Health   Financial Resource Strain: Not on file  Food Insecurity: No Food Insecurity (05/06/2017)   Hunger Vital Sign    Worried About Running Out of Food in the Last Year: Never true    Ran Out of Food in the Last Year: Never true  Transportation Needs: Not on file  Physical Activity: Not on file  Stress: Not on file  Social Connections: Not on file    Family History  Problem Relation Age of Onset   Other Father        SB blockage, + colostomy, ? cancer    Colon cancer Neg Hx    Colon polyps Neg Hx    Esophageal cancer Neg Hx    Rectal cancer Neg Hx    Stomach cancer Neg Hx     Health Maintenance  Topic Date Due   Influenza Vaccine  07/19/2024 (Originally 11/20/2023)   Pneumococcal Vaccine: 50+ Years (1 of 1 - PCV) 01/11/2025 (Originally  08/31/2019)   Hepatitis B Vaccines 19-59 Average Risk (1 of 3 - 19+ 3-dose series) 01/11/2025 (Originally 08/30/1988)   Hepatitis C Screening  01/11/2025 (Originally 08/31/1987)   COVID-19 Vaccine (5 - 2025-26 season) 01/27/2025 (Originally 12/21/2023)   Colonoscopy  10/13/2029   DTaP/Tdap/Td (3 - Td or Tdap) 03/31/2033   HIV Screening  Completed   Zoster Vaccines- Shingrix  Completed   HPV VACCINES  Aged Out   Meningococcal B Vaccine  Aged Out     ----------------------------------------------------------------------------------------------------------------------------------------------------------------------------------------------------------------- Physical Exam BP 138/84   Pulse 61   Ht 6' (1.829 m)   Wt 240 lb (108.9 kg)   SpO2 99%   BMI 32.55 kg/m   Physical Exam Constitutional:      Appearance: Normal appearance.  Cardiovascular:     Rate and Rhythm: Normal rate and regular rhythm.  Pulmonary:     Effort: Pulmonary effort is  normal.     Breath sounds: Normal breath sounds.  Neurological:     General: No focal deficit present.     Mental Status: He is alert.  Psychiatric:        Mood and Affect: Mood normal.        Behavior: Behavior normal.     ------------------------------------------------------------------------------------------------------------------------------------------------------------------------------------------------------------------- Assessment and Plan  Essential hypertension Blood pressure remains well controlled with current medications.  We will plan to continue.  Refills provided.  Brain fog Continues to have some issues with brain fog and word finding at times.  Was unable to complete MRI previously.  Updating labs today.  Cancer screening Requesting screenings for cancers.  Discussed that there is not a universal test at certain things including his PSA.  He is up-to-date on colon cancer screenings.   Meds ordered this encounter  Medications   olmesartan -hydrochlorothiazide (BENICAR  HCT) 20-12.5 MG tablet    Sig: Take 1 tablet by mouth daily.    Dispense:  90 tablet    Refill:  1   sildenafil  (VIAGRA ) 100 MG tablet    Sig: Take 1 tablet (100 mg total) by mouth daily as needed for erectile dysfunction.    Dispense:  30 tablet    Refill:  0   tamsulosin  (FLOMAX ) 0.4 MG CAPS capsule    Sig: Take 1 capsule (0.4 mg total) by mouth daily.    Dispense:  90 capsule    Refill:  3    No follow-ups on file.

## 2024-01-15 LAB — CMP14+EGFR
ALT: 21 IU/L (ref 0–44)
AST: 26 IU/L (ref 0–40)
Albumin: 4.3 g/dL (ref 3.8–4.9)
Alkaline Phosphatase: 106 IU/L (ref 47–123)
BUN/Creatinine Ratio: 13 (ref 9–20)
BUN: 16 mg/dL (ref 6–24)
Bilirubin Total: 0.9 mg/dL (ref 0.0–1.2)
CO2: 24 mmol/L (ref 20–29)
Calcium: 9.2 mg/dL (ref 8.7–10.2)
Chloride: 102 mmol/L (ref 96–106)
Creatinine, Ser: 1.19 mg/dL (ref 0.76–1.27)
Globulin, Total: 2.9 g/dL (ref 1.5–4.5)
Glucose: 91 mg/dL (ref 70–99)
Potassium: 4.4 mmol/L (ref 3.5–5.2)
Sodium: 138 mmol/L (ref 134–144)
Total Protein: 7.2 g/dL (ref 6.0–8.5)
eGFR: 73 mL/min/1.73 (ref >59)

## 2024-01-15 LAB — CBC WITH DIFFERENTIAL/PLATELET
Basophils Absolute: 0 x10E3/uL (ref 0.0–0.2)
Basos: 0 %
EOS (ABSOLUTE): 0.1 x10E3/uL (ref 0.0–0.4)
Eos: 2 %
Hematocrit: 44.2 % (ref 37.5–51.0)
Hemoglobin: 14.1 g/dL (ref 13.0–17.7)
Immature Grans (Abs): 0 x10E3/uL (ref 0.0–0.1)
Immature Granulocytes: 0 %
Lymphocytes Absolute: 1.7 x10E3/uL (ref 0.7–3.1)
Lymphs: 34 %
MCH: 29.6 pg (ref 26.6–33.0)
MCHC: 31.9 g/dL (ref 31.5–35.7)
MCV: 93 fL (ref 79–97)
Monocytes Absolute: 0.4 x10E3/uL (ref 0.1–0.9)
Monocytes: 8 %
Neutrophils Absolute: 2.8 x10E3/uL (ref 1.4–7.0)
Neutrophils: 56 %
Platelets: 194 x10E3/uL (ref 150–450)
RBC: 4.76 x10E6/uL (ref 4.14–5.80)
RDW: 12.9 % (ref 11.6–15.4)
WBC: 5.2 x10E3/uL (ref 3.4–10.8)

## 2024-01-15 LAB — ZINC: Zinc: 61 ug/dL (ref 44–115)

## 2024-01-15 LAB — TSH: TSH: 1.71 u[IU]/mL (ref 0.450–4.500)

## 2024-01-15 LAB — VITAMIN D 25 HYDROXY (VIT D DEFICIENCY, FRACTURES): Vit D, 25-Hydroxy: 14.9 ng/mL — ABNORMAL LOW (ref 30.0–100.0)

## 2024-01-15 LAB — TESTOSTERONE: Testosterone: 839 ng/dL (ref 264–916)

## 2024-01-15 LAB — VITAMIN B12: Vitamin B-12: 365 pg/mL (ref 232–1245)

## 2024-01-15 LAB — PSA: Prostate Specific Ag, Serum: 0.6 ng/mL (ref 0.0–4.0)

## 2024-01-17 DIAGNOSIS — R4189 Other symptoms and signs involving cognitive functions and awareness: Secondary | ICD-10-CM | POA: Insufficient documentation

## 2024-01-17 DIAGNOSIS — Z129 Encounter for screening for malignant neoplasm, site unspecified: Secondary | ICD-10-CM | POA: Insufficient documentation

## 2024-01-17 NOTE — Assessment & Plan Note (Signed)
 Requesting screenings for cancers.  Discussed that there is not a universal test at certain things including his PSA.  He is up-to-date on colon cancer screenings.

## 2024-01-17 NOTE — Assessment & Plan Note (Signed)
 Continues to have some issues with brain fog and word finding at times.  Was unable to complete MRI previously.  Updating labs today.

## 2024-01-17 NOTE — Assessment & Plan Note (Signed)
Blood pressure remains well controlled with current medications.  We will plan to continue.  Refills provided.

## 2024-01-19 ENCOUNTER — Other Ambulatory Visit: Payer: Self-pay | Admitting: Family Medicine

## 2024-01-19 NOTE — Telephone Encounter (Signed)
 Request for completed Rx refill.  Med last filled 2024.

## 2024-01-25 ENCOUNTER — Ambulatory Visit: Payer: Self-pay | Admitting: Family Medicine

## 2024-04-25 ENCOUNTER — Encounter (HOSPITAL_COMMUNITY): Payer: Self-pay | Admitting: *Deleted

## 2024-04-26 ENCOUNTER — Encounter: Payer: Self-pay | Admitting: Family Medicine

## 2024-04-26 ENCOUNTER — Ambulatory Visit: Admitting: Family Medicine

## 2024-04-26 VITALS — BP 131/86 | HR 71 | Ht 72.0 in | Wt 249.0 lb

## 2024-04-26 DIAGNOSIS — K645 Perianal venous thrombosis: Secondary | ICD-10-CM | POA: Diagnosis not present

## 2024-04-26 DIAGNOSIS — Z1211 Encounter for screening for malignant neoplasm of colon: Secondary | ICD-10-CM | POA: Diagnosis not present

## 2024-04-26 MED ORDER — HYDROCORTISONE ACETATE 25 MG RE SUPP: 25.0000 mg | Freq: Two times a day (BID) | RECTAL | 11 refills | Status: AC | PRN

## 2024-04-26 MED ORDER — NITROGLYCERIN 0.4 % RE OINT: TOPICAL_OINTMENT | RECTAL | 0 refills | Status: AC

## 2024-04-26 MED ORDER — LIDOCAINE 5 % EX OINT: 1.0000 | TOPICAL_OINTMENT | Freq: Four times a day (QID) | CUTANEOUS | 0 refills | Status: AC | PRN

## 2024-04-26 NOTE — Patient Instructions (Addendum)
 Try anusol  suppository  Topical nitroglycerin  twice daily (DO NOT USE VIAGRA /SILDENAFIL  while using nitroglycerin ) Lidocaine  ointment as needed for pain.  Witch hazel (tucks paids) for irritation.    How to Take a Sitz Bath A sitz bath is a warm water bath that may be used to care for your rectum, genital area, or the area between your rectum and genitals (perineum). In a sitz bath, the water only comes up to your hips and covers your buttocks. A sitz bath may be done in a bathtub or with a portable sitz bath that fits over the toilet. Your health care provider may recommend a sitz bath to help: Relieve pain and discomfort after delivering a baby. Relieve pain and itching from hemorrhoids or anal fissures. Relieve pain after certain surgeries. Relax muscles that are sore or tight. How to take a sitz bath Take 2-4 sitz baths a day, or as many as told by your health care provider. Bathtub sitz bath To take a sitz bath in a bathtub: Partially fill a bathtub with warm water. The water should be deep enough to cover your hips and buttocks when you are sitting in the bathtub. Follow your health care provider's instructions if you are told to put medicine in the water. Sit in the water. Open the bathtub drain a little, and leave it open during your bath. Turn on the warm water again, enough to replace the water that is draining out. Keep the water running throughout your bath. This helps keep the water at the right level and temperature. Soak in the water for 15-20 minutes, or as long as told by your health care provider. When you are done, be careful when you stand up. You may feel dizzy. After the sitz bath, pat yourself dry. Do not rub your skin to dry it.  Over-the-toilet sitz bath To take a sitz bath with an over-the-toilet basin: Follow the manufacturer's instructions. Fill the basin with warm water. Follow your health care provider's instructions if you were told to put medicine in the  water. Sit on the seat. Make sure the water covers your buttocks and perineum. Soak in the water for 15-20 minutes, or as long as told by your health care provider. After the sitz bath, pat yourself dry. Do not rub your skin to dry it. Clean and dry the basin between uses. Discard the basin if it cracks, or according to the manufacturer's instructions.  Contact a health care provider if: Your pain or itching gets worse. Stop doing sitz baths if your symptoms get worse. You have new symptoms. Stop doing sitz baths until you talk with your health care provider. Summary A sitz bath is a warm water bath in which the water only comes up to your hips and covers your buttocks. Your health care provider may recommend a sitz bath to help relieve pain and discomfort after delivering a baby, relieve pain and itching from hemorrhoids or anal fissures, relieve pain after certain surgeries, or help to relax muscles that are sore or tight. Take 2-4 sitz baths a day, or as many as told by your health care provider. Soak in the water for 15-20 minutes. Stop doing sitz baths if your symptoms get worse. This information is not intended to replace advice given to you by your health care provider. Make sure you discuss any questions you have with your health care provider. Document Revised: 07/09/2021 Document Reviewed: 07/09/2021 Elsevier Patient Education  2024 [employer].

## 2024-04-26 NOTE — Progress Notes (Signed)
 " Javier Joyce - 54 y.o. male MRN [id number]  Date of birth: 28-Dec-1970  Subjective Chief Complaint  Patient presents with   Hemorrhoids   digestion issue    HPI Javier Joyce is a 54 year old male here today with complaint of anal pain.  He does have history of constipation and has had anal fissures in the past.  Reports this feels different than his fissure.  He does notice nodule in the perirectal area.  Small amount of bleeding with bowel movements.  Has significant pain with bowel movements.  Denies abdominal pain.  No fever or chills.  Has tried nifedipine  ointment without much improvement.  ROS:  A comprehensive ROS was completed and negative except as noted per HPI  Allergies[1]  Past Medical History:  Diagnosis Date   Anxiety    Arthritis    knees    Complication of anesthesia    Pt has woken up during the procedure   Depression    Hypertension    Sleep apnea    past hx     Past Surgical History:  Procedure Laterality Date   ACHILLES TENDON REPAIR Bilateral 2008   LAPAROSCOPIC GASTRIC SLEEVE RESECTION N/A 09/15/2017   Procedure: LAPAROSCOPIC GASTRIC SLEEVE RESECTION WITH UPPER ENDO AND ERAS PATHWAY;  Surgeon: Gladis Cough, MD;  Location: [facility];  Service: General;  Laterality: N/A;   PATELLAR TENDON REPAIR Bilateral    right and left knee repair      Social History   Socioeconomic History   Marital status: Married    Spouse name: Not on file   Number of children: Not on file   Years of education: Not on file   Highest education level: Not on file  Occupational History   Not on file  Tobacco Use   Smoking status: Former    Current packs/day: 0.00    Types: Cigarettes    Start date: 08/25/2015    Quit date: 08/24/2017    Years since quitting: 6.6   Smokeless tobacco: Never   Tobacco comments:    Social smoker  Vaping Use   Vaping status: Never Used  Substance and Sexual Activity   Alcohol use: Yes    Comment: occass   Drug use: Never   Sexual  activity: Yes  Other Topics Concern   Not on file  Social History Narrative   Not on file   Social Drivers of Health   Tobacco Use: Medium Risk (01/12/2024)   Patient History    Smoking Tobacco Use: Former    Smokeless Tobacco Use: Never    Passive Exposure: Not on Actuary Strain: Low Risk (04/26/2024)   Overall Financial Resource Strain (CARDIA)    Difficulty of Paying Living Expenses: Not hard at all  Food Insecurity: No Food Insecurity (04/26/2024)   Epic    Worried About Programme Researcher, Broadcasting/film/video in the Last Year: Never true    Ran Out of Food in the Last Year: Never true  Transportation Needs: No Transportation Needs (04/26/2024)   Epic    Lack of Transportation (Medical): No    Lack of Transportation (Non-Medical): No  Physical Activity: Insufficiently Active (04/26/2024)   Exercise Vital Sign    Days of Exercise per Week: 3 days    Minutes of Exercise per Session: 40 min  Stress: No Stress Concern Present (04/26/2024)   [employer] of Occupational Health - Occupational Stress Questionnaire    Feeling of Stress: Only a little  Social Connections: Moderately  Integrated (04/26/2024)   Social Connection and Isolation Panel    Frequency of Communication with Friends and Family: More than three times a week    Frequency of Social Gatherings with Friends and Family: Three times a week    Attends Religious Services: 1 to 4 times per year    Active Member of Clubs or Organizations: No    Attends Banker Meetings: Never    Marital Status: Married  Depression (PHQ2-9): High Risk (04/26/2024)   Depression (PHQ2-9)    PHQ-2 Score: 13  Alcohol Screen: Low Risk (04/26/2024)   Alcohol Screen    Last Alcohol Screening Score (AUDIT): 2  Housing: Low Risk (04/26/2024)   Epic    Unable to Pay for Housing in the Last Year: No    Number of Times Moved in the Last Year: 0    Homeless in the Last Year: No  Utilities: Not At Risk (04/26/2024)   Epic    Threatened with loss  of utilities: No  Health Literacy: Adequate Health Literacy (04/26/2024)   B1300 Health Literacy    Frequency of need for help with medical instructions: Rarely    Family History  Problem Relation Age of Onset   Other Father        SB blockage, + colostomy, ? cancer    Colon cancer Neg Hx    Colon polyps Neg Hx    Esophageal cancer Neg Hx    Rectal cancer Neg Hx    Stomach cancer Neg Hx     Health Maintenance  Topic Date Due   Influenza Vaccine  07/19/2024 (Originally 11/20/2023)   Pneumococcal Vaccine: 50+ Years (1 of 1 - PCV) 01/11/2025 (Originally 08/31/2019)   Hepatitis B Vaccines 19-59 Average Risk (1 of 3 - 19+ 3-dose series) 01/11/2025 (Originally 08/30/1988)   Hepatitis C Screening  01/11/2025 (Originally 08/31/1987)   COVID-19 Vaccine (5 - 2025-26 season) 01/27/2025 (Originally 12/21/2023)   Colonoscopy  10/13/2029   DTaP/Tdap/Td (3 - Td or Tdap) 03/31/2033   HIV Screening  Completed   Zoster Vaccines- Shingrix  Completed   HPV VACCINES  Aged Out   Meningococcal B Vaccine  Aged Out     ----------------------------------------------------------------------------------------------------------------------------------------------------------------------------------------------------------------- Physical Exam BP 131/86 (BP Location: Right Arm, Patient Position: Sitting, Cuff Size: Large)   Pulse 71   Ht 6' (1.829 m)   Wt 249 lb (112.9 kg)   SpO2 99%   BMI 33.77 kg/m   Physical Exam Constitutional:      Appearance: Normal appearance.  Eyes:     General: No scleral icterus. Cardiovascular:     Rate and Rhythm: Normal rate and regular rhythm.  Pulmonary:     Effort: Pulmonary effort is normal.     Breath sounds: Normal breath sounds.  Genitourinary:    Comments: Declines rectal exam..  He does have a picture on his phone consistent with thrombosed hemorrhoid. Neurological:     Mental Status: He is alert.  Psychiatric:        Mood and Affect: Mood normal.         Behavior: Behavior normal.     ------------------------------------------------------------------------------------------------------------------------------------------------------------------------------------------------------------------- Assessment and Plan  Thrombosed hemorrhoids He has had this for a few weeks.  We discussed increasing fiber intake and continuing stool softener.  Sitz bath and witch hazel wipes recommended.  Be sure to stay well-hydrated.  Adding combination of topical nitroglycerin  and lidocaine .  Addition we will add Anusol  suppositories.  He will let me know if not improving with these.  Meds ordered this encounter  Medications   hydrocortisone  (ANUSOL -HC) 25 MG suppository    Sig: Place 1 suppository (25 mg total) rectally 2 (two) times daily as needed for hemorrhoids.    Dispense:  24 suppository    Refill:  11   Nitroglycerin  0.4 % OINT    Sig: Apply 1 inch (375 mg) ointment intra-anally every 12 hours    Dispense:  30 g    Refill:  0   lidocaine  (XYLOCAINE ) 5 % ointment    Sig: Apply 1 Application topically 4 (four) times daily as needed.    Dispense:  50 g    Refill:  0    No follow-ups on file.        [1]  Allergies Allergen Reactions   Lisinopril  Cough   Losartan  Cough    Pt has lots of swelling if he does not have diuretic with this medication    Ciprofloxacin Itching   Keflex [Cephalexin] Itching   "

## 2024-04-26 NOTE — Assessment & Plan Note (Signed)
 He has had this for a few weeks.  We discussed increasing fiber intake and continuing stool softener.  Sitz bath and witch hazel wipes recommended.  Be sure to stay well-hydrated.  Adding combination of topical nitroglycerin  and lidocaine .  Addition we will add Anusol  suppositories.  He will let me know if not improving with these.

## 2024-05-20 ENCOUNTER — Ambulatory Visit: Admitting: Family Medicine

## 2024-05-20 VITALS — BP 125/85 | HR 112 | Temp 98.7°F | Ht 72.0 in | Wt 248.0 lb

## 2024-05-20 DIAGNOSIS — R6889 Other general symptoms and signs: Secondary | ICD-10-CM

## 2024-05-20 DIAGNOSIS — J069 Acute upper respiratory infection, unspecified: Secondary | ICD-10-CM | POA: Diagnosis not present

## 2024-05-20 LAB — POC COVID19/FLU A&B COMBO
Covid Antigen, POC: NEGATIVE
Influenza A Antigen, POC: NEGATIVE
Influenza B Antigen, POC: NEGATIVE

## 2024-05-20 MED ORDER — BENZONATATE 200 MG PO CAPS: 200.0000 mg | ORAL_CAPSULE | Freq: Two times a day (BID) | ORAL | 0 refills | Status: AC | PRN

## 2024-05-20 MED ORDER — HYDROCOD POLI-CHLORPHE POLI ER 10-8 MG/5ML PO SUER: 5.0000 mL | Freq: Two times a day (BID) | ORAL | 0 refills | Status: AC | PRN

## 2024-05-20 NOTE — Patient Instructions (Signed)

## 2024-05-23 ENCOUNTER — Encounter: Payer: Self-pay | Admitting: Family Medicine

## 2024-05-23 DIAGNOSIS — J069 Acute upper respiratory infection, unspecified: Secondary | ICD-10-CM | POA: Insufficient documentation

## 2024-05-23 NOTE — Assessment & Plan Note (Signed)
"    COVID and flu testing in clinic are negative.  Encouraged continued supportive care with increase fluids.  Adding Tussionex with Tessalon  Perles as needed.  Red flags reviewed. "
